# Patient Record
Sex: Male | Born: 1960 | Race: Black or African American | Hispanic: No | Marital: Single | Smoking: Never smoker
Health system: Southern US, Community
[De-identification: ages and names within clinical notes are randomized; demographics above are authoritative.]

## PROBLEM LIST (undated history)

## (undated) DIAGNOSIS — I10 Essential (primary) hypertension: Secondary | ICD-10-CM

## (undated) DIAGNOSIS — G4730 Sleep apnea, unspecified: Secondary | ICD-10-CM

## (undated) DIAGNOSIS — E785 Hyperlipidemia, unspecified: Secondary | ICD-10-CM

## (undated) HISTORY — DX: Essential (primary) hypertension: I10

## (undated) HISTORY — DX: Sleep apnea, unspecified: G47.30

## (undated) HISTORY — PX: DENTAL EXAMINATION UNDER ANESTHESIA: SHX1447

## (undated) HISTORY — DX: Hyperlipidemia, unspecified: E78.5

---

## 2012-06-21 ENCOUNTER — Ambulatory Visit (INDEPENDENT_AMBULATORY_CARE_PROVIDER_SITE_OTHER): Payer: Managed Care, Other (non HMO) | Admitting: Family Medicine

## 2012-06-21 VITALS — BP 154/88 | HR 61 | Temp 98.6°F | Resp 16 | Ht 70.5 in | Wt 223.0 lb

## 2012-06-21 DIAGNOSIS — I10 Essential (primary) hypertension: Secondary | ICD-10-CM

## 2012-06-21 DIAGNOSIS — E785 Hyperlipidemia, unspecified: Secondary | ICD-10-CM

## 2012-06-21 LAB — COMPREHENSIVE METABOLIC PANEL
ALT: 20 U/L (ref 0–53)
Albumin: 4.3 g/dL (ref 3.5–5.2)
CO2: 30 mEq/L (ref 19–32)
Calcium: 9.4 mg/dL (ref 8.4–10.5)
Chloride: 102 mEq/L (ref 96–112)
Potassium: 3.4 mEq/L — ABNORMAL LOW (ref 3.5–5.3)
Sodium: 141 mEq/L (ref 135–145)
Total Bilirubin: 0.7 mg/dL (ref 0.3–1.2)
Total Protein: 7.4 g/dL (ref 6.0–8.3)

## 2012-06-21 LAB — LIPID PANEL
Cholesterol: 264 mg/dL — ABNORMAL HIGH (ref 0–200)
VLDL: 66 mg/dL — ABNORMAL HIGH (ref 0–40)

## 2012-06-21 MED ORDER — NEBIVOLOL HCL 10 MG PO TABS: 10.0000 mg | ORAL_TABLET | Freq: Every day | ORAL | Status: DC

## 2012-06-21 MED ORDER — LISINOPRIL-HYDROCHLOROTHIAZIDE 20-12.5 MG PO TABS: 1.0000 | ORAL_TABLET | Freq: Two times a day (BID) | ORAL | Status: DC

## 2012-06-21 MED ORDER — AMLODIPINE BESYLATE 5 MG PO TABS: 5.0000 mg | ORAL_TABLET | Freq: Every day | ORAL | Status: DC

## 2012-06-21 MED ORDER — ROSUVASTATIN CALCIUM 20 MG PO TABS: 20.0000 mg | ORAL_TABLET | Freq: Every day | ORAL | Status: DC

## 2012-06-21 NOTE — Patient Instructions (Signed)
Continue same medication  Return in about 4 months or so for a recheck

## 2012-06-21 NOTE — Progress Notes (Signed)
Subjective: 51 year old man who has a history of hypertension and hyperlipidemia. He is out of his bysystolic blood pressure medication. He takes walks on a fairly regular basis, but it is been out of town a lot so that its broken out. Denies any chest pains headaches shortness of breath or other major issues. He still sees his cardiologist, Dr. Gery Carson.  Objective: Blood pressure is noted. On repeat I got 146/84. TMs normal. Throat clear. Neck supple without nodes. Chest clear to auscultation. Heart regular without murmur.   Assessment:  Hypertension Hyperlipidemia  Plan: Check labs. Advised to return in about for 4 months for followup check. He considers Dr. Chilton Carson to be his primary.

## 2013-01-05 ENCOUNTER — Ambulatory Visit (INDEPENDENT_AMBULATORY_CARE_PROVIDER_SITE_OTHER): Payer: Managed Care, Other (non HMO) | Admitting: Family Medicine

## 2013-01-05 ENCOUNTER — Ambulatory Visit: Payer: Managed Care, Other (non HMO)

## 2013-01-05 VITALS — BP 110/70 | HR 54 | Temp 98.3°F | Resp 12 | Ht 69.0 in | Wt 212.0 lb

## 2013-01-05 DIAGNOSIS — M25552 Pain in left hip: Secondary | ICD-10-CM

## 2013-01-05 DIAGNOSIS — M169 Osteoarthritis of hip, unspecified: Secondary | ICD-10-CM

## 2013-01-05 DIAGNOSIS — M25559 Pain in unspecified hip: Secondary | ICD-10-CM

## 2013-01-05 DIAGNOSIS — M1610 Unilateral primary osteoarthritis, unspecified hip: Secondary | ICD-10-CM

## 2013-01-05 MED ORDER — PREDNISONE 20 MG PO TABS: 40.0000 mg | ORAL_TABLET | Freq: Every day | ORAL | Status: DC

## 2013-01-05 NOTE — Progress Notes (Signed)
  Subjective:    Patient ID: Seth Carson, male    DOB: Jun 05, 1961, 52 y.o.   MRN: [id number]  HPI   Seth Carson is a very pleasant 52 yr old male here with concern for left hip pain.  Noted some pain last week.  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  Noted worsening hip pain yesterday and the day before.  Denies trauma to this hip.  Has never had problems with this hip in the past.  A little LBP as well and some knee discomfort which he attributes to his altered gait from the hip pain.  No groin pain.  No weakness, numbness, or tingling.  Right hip feels ok.     Review of Systems  Constitutional: Negative for fever and chills.  HENT: Negative.   Respiratory: Negative.   Cardiovascular: Negative.   Gastrointestinal: Negative.   Musculoskeletal: Positive for myalgias and arthralgias.  Skin: Negative.   Neurological: Negative.  Negative for weakness and numbness.       Objective:   Physical Exam  Vitals reviewed. Constitutional: He is oriented to person, place, and time. He appears well-developed and well-nourished. No distress.  HENT:  Head: Normocephalic and atraumatic.  Eyes: Conjunctivae are normal. No scleral icterus.  Musculoskeletal:       Right hip: Normal.       Left hip: He exhibits decreased range of motion and tenderness.       Left knee: Normal.       Legs: Generalized tenderness around left hip; TTP over the trochanteric bursa; increased pain with passive flexion and abduction; full passive IR, ER; antalgic gait; limited active flexion, extension, abduction  Neurological: He is alert and oriented to person, place, and time. He has normal reflexes.  Skin: Skin is warm and dry.  Psychiatric: He has a normal mood and affect. His behavior is normal.      [facility] reading (PRIMARY) by  Dr. Milus Glazier - small spur at the lip of the acetabulum      Assessment & Plan:   Hip pain, acute, left - Plan: DG Hip Complete Left, predniSONE (DELTASONE) 20 MG tablet  Osteoarthritis of hip - Plan: predniSONE (DELTASONE) 20 MG tablet   Seth Carson is a very pleasant 52 yr old male with left hip pain.  Pain began after increased physical activity at work.  X-ray shows a small spur at the left acetabulum, otherwise no acute findings.  Will try a short course of prednisone to calm down inflammation/pain.  May continue Tylenol as needed.  Note provided for work today.  Pt to RTC if worsening or not improving.

## 2013-01-05 NOTE — Patient Instructions (Addendum)
Begin taking the prednisone (40mg  daily for 5 days) - this will calm down the inflammation in the hip.  You may continue using Tylenol for pain relief.  Relative rest for the next day or two, but do not be completely sedentary.  Please let us know if you are worsening or not improving.

## 2013-06-10 ENCOUNTER — Ambulatory Visit (INDEPENDENT_AMBULATORY_CARE_PROVIDER_SITE_OTHER): Payer: Managed Care, Other (non HMO) | Admitting: Physician Assistant

## 2013-06-10 VITALS — BP 134/82 | HR 64 | Temp 98.6°F | Resp 18 | Ht 70.0 in | Wt 217.0 lb

## 2013-06-10 DIAGNOSIS — I10 Essential (primary) hypertension: Secondary | ICD-10-CM | POA: Insufficient documentation

## 2013-06-10 DIAGNOSIS — E785 Hyperlipidemia, unspecified: Secondary | ICD-10-CM | POA: Insufficient documentation

## 2013-06-10 DIAGNOSIS — R05 Cough: Secondary | ICD-10-CM

## 2013-06-10 MED ORDER — IPRATROPIUM BROMIDE 0.03 % NA SOLN: 2.0000 | Freq: Two times a day (BID) | NASAL | Status: DC

## 2013-06-10 MED ORDER — AMOXICILLIN 875 MG PO TABS: 875.0000 mg | ORAL_TABLET | Freq: Two times a day (BID) | ORAL | Status: DC

## 2013-06-10 MED ORDER — BENZONATATE 100 MG PO CAPS: 100.0000 mg | ORAL_CAPSULE | Freq: Three times a day (TID) | ORAL | Status: DC | PRN

## 2013-06-10 NOTE — Patient Instructions (Signed)
Get plenty of rest and drink at least 64 ounces of water daily. 

## 2013-06-10 NOTE — Progress Notes (Signed)
   Subjective:    Patient ID: Seth Carson, male    DOB: 1960-09-18, 52 y.o.   MRN: [id number]  PCP: [name], MD  Chief Complaint  Patient presents with  . Cough    comes and going for 2 weeks now    Medications, allergies, past medical history, surgical history, family history, social history and problem list reviewed and updated.  HPI  Cough x 2-3 weeks. "Thought I had a cold.  Took some stuff and it got better for a day or two." Symptoms recurred after spending a few hours outside, and then resolved a few days later.  Continues to experience this pattern of waxing/waning symptoms. Cough produces mucous.  Nasal congestion. No ST, HA, diarrhea, unexplained muscle or joint aches. Coughs to the point of gagging and feeling lightheaded on occasion.  Cough is worse when he exerts himself or gets hot. No LE edema.  Can sleep lying flat.  Review of Systems As above.    Objective:   Physical Exam Blood pressure 134/82, pulse 64, temperature 98.6 F (37 C), temperature source Oral, resp. rate 18, height 5\' 10"  (1.778 m), weight 217 lb (98.431 kg), SpO2 94.00%. Body mass index is 31.14 kg/(m^2). Well-developed, well nourished BM who is awake, alert and oriented, in NAD. HEENT: [place]/AT, PERRL, EOMI.  Sclera and conjunctiva are clear.  EAC are patent, TMs are normal in appearance. Nasal mucosa is moderately congested, pink and moist. OP is clear. Neck: supple, non-tender, no lymphadenopathy, thyromegaly. Heart: RRR, no murmur Lungs: normal effort, CTA Extremities: no cyanosis, clubbing or edema. Skin: warm and dry without rash. Psychologic: good mood and appropriate affect, normal speech and behavior.        Assessment & Plan:  1. Cough Likely subacute sinusitis and post-nasal draiange. - ipratropium (ATROVENT) 0.03 % nasal spray; Place 2 sprays into both nostrils 2 (two) times daily.  Dispense: 30 mL; Refill: 0 - benzonatate (TESSALON) 100 MG capsule; Take 1-2 capsules  (100-200 mg total) by mouth 3 (three) times daily as needed for cough.  Dispense: 40 capsule; Refill: 0 - amoxicillin (AMOXIL) 875 MG tablet; Take 1 tablet (875 mg total) by mouth 2 (two) times daily.  Dispense: 20 tablet; Refill: 0 -Supportive care.  Anticipatory guidance.  RTC if symptoms worsen/persist.    Fernande Bras, PA-C Physician Assistant-Certified [facility]

## 2013-07-08 ENCOUNTER — Ambulatory Visit (INDEPENDENT_AMBULATORY_CARE_PROVIDER_SITE_OTHER): Payer: Managed Care, Other (non HMO) | Admitting: Physician Assistant

## 2013-07-08 VITALS — BP 124/80 | HR 72 | Temp 98.4°F | Resp 16 | Ht 70.0 in | Wt 213.0 lb

## 2013-07-08 DIAGNOSIS — R001 Bradycardia, unspecified: Secondary | ICD-10-CM

## 2013-07-08 DIAGNOSIS — I10 Essential (primary) hypertension: Secondary | ICD-10-CM

## 2013-07-08 DIAGNOSIS — R0989 Other specified symptoms and signs involving the circulatory and respiratory systems: Secondary | ICD-10-CM

## 2013-07-08 DIAGNOSIS — Z1211 Encounter for screening for malignant neoplasm of colon: Secondary | ICD-10-CM

## 2013-07-08 DIAGNOSIS — Z23 Encounter for immunization: Secondary | ICD-10-CM

## 2013-07-08 DIAGNOSIS — Z125 Encounter for screening for malignant neoplasm of prostate: Secondary | ICD-10-CM

## 2013-07-08 DIAGNOSIS — E785 Hyperlipidemia, unspecified: Secondary | ICD-10-CM

## 2013-07-08 DIAGNOSIS — I498 Other specified cardiac arrhythmias: Secondary | ICD-10-CM

## 2013-07-08 DIAGNOSIS — R5381 Other malaise: Secondary | ICD-10-CM

## 2013-07-08 DIAGNOSIS — R5383 Other fatigue: Secondary | ICD-10-CM

## 2013-07-08 DIAGNOSIS — R0609 Other forms of dyspnea: Secondary | ICD-10-CM

## 2013-07-08 DIAGNOSIS — Z1159 Encounter for screening for other viral diseases: Secondary | ICD-10-CM

## 2013-07-08 DIAGNOSIS — R0683 Snoring: Secondary | ICD-10-CM

## 2013-07-08 LAB — COMPREHENSIVE METABOLIC PANEL
ALT: 20 U/L (ref 0–53)
AST: 19 U/L (ref 0–37)
Albumin: 4.2 g/dL (ref 3.5–5.2)
Alkaline Phosphatase: 47 U/L (ref 39–117)
BILIRUBIN TOTAL: 0.9 mg/dL (ref 0.3–1.2)
BUN: 13 mg/dL (ref 6–23)
CALCIUM: 9.7 mg/dL (ref 8.4–10.5)
CHLORIDE: 99 meq/L (ref 96–112)
CO2: 31 meq/L (ref 19–32)
CREATININE: 1.15 mg/dL (ref 0.50–1.35)
GLUCOSE: 91 mg/dL (ref 70–99)
Potassium: 3.6 mEq/L (ref 3.5–5.3)
SODIUM: 139 meq/L (ref 135–145)
TOTAL PROTEIN: 7.4 g/dL (ref 6.0–8.3)

## 2013-07-08 LAB — POCT CBC
Granulocyte percent: 42.4 %G (ref 37–80)
HEMATOCRIT: 47.3 % (ref 43.5–53.7)
HEMOGLOBIN: 15.1 g/dL (ref 14.1–18.1)
LYMPH, POC: 2.5 (ref 0.6–3.4)
MCH: 28.5 pg (ref 27–31.2)
MCHC: 31.9 g/dL (ref 31.8–35.4)
MCV: 89.3 fL (ref 80–97)
MID (cbc): 0.4 (ref 0–0.9)
MPV: 9.2 fL (ref 0–99.8)
POC Granulocyte: 2.1 (ref 2–6.9)
POC LYMPH PERCENT: 50.1 %L — AB (ref 10–50)
POC MID %: 7.5 %M (ref 0–12)
Platelet Count, POC: 215 10*3/uL (ref 142–424)
RBC: 5.3 M/uL (ref 4.69–6.13)
RDW, POC: 13.7 %
WBC: 5 10*3/uL (ref 4.6–10.2)

## 2013-07-08 LAB — PSA: PSA: 1.17 ng/mL (ref <=4.00)

## 2013-07-08 LAB — LIPID PANEL
CHOL/HDL RATIO: 3.6 ratio
CHOLESTEROL: 124 mg/dL (ref 0–200)
HDL: 34 mg/dL — ABNORMAL LOW (ref >39)
LDL Cholesterol: 59 mg/dL (ref 0–99)
Triglycerides: 153 mg/dL — ABNORMAL HIGH (ref <150)
VLDL: 31 mg/dL (ref 0–40)

## 2013-07-08 LAB — TSH: TSH: 1.272 u[IU]/mL (ref 0.350–4.500)

## 2013-07-08 LAB — HEPATITIS C ANTIBODY: HCV AB: NEGATIVE

## 2013-07-08 MED ORDER — NEBIVOLOL HCL 5 MG PO TABS: 5.0000 mg | ORAL_TABLET | Freq: Every day | ORAL | Status: DC

## 2013-07-08 MED ORDER — LISINOPRIL-HYDROCHLOROTHIAZIDE 20-12.5 MG PO TABS: 1.0000 | ORAL_TABLET | Freq: Two times a day (BID) | ORAL | Status: DC

## 2013-07-08 MED ORDER — AMLODIPINE BESYLATE 5 MG PO TABS: 5.0000 mg | ORAL_TABLET | Freq: Every day | ORAL | Status: DC

## 2013-07-08 MED ORDER — ROSUVASTATIN CALCIUM 20 MG PO TABS: 20.0000 mg | ORAL_TABLET | Freq: Every day | ORAL | Status: DC

## 2013-07-08 NOTE — Patient Instructions (Signed)
I will contact you with your lab results as soon as they are available.   If you have not heard from me in 2 weeks, please contact me.  The fastest way to get your results is to register for My Chart (see the instructions on the last page of this printout).  If you have not heard anything regarding the referrals (GI for colonoscopy and [facility] for Sleep Study) in 10 days, please contact our office.

## 2013-07-08 NOTE — Progress Notes (Signed)
Subjective:    Patient ID: Seth Carson, male    DOB: [birth year], 53 y.o.   MRN: [id number]  Reviewed medications, allergies, past medical history, family history and social history.  HPI  Not having any issues with blood pressure. Has stopped monitoring bp at home because it has consistently been within a normal range. Taking blood pressure medications as prescribed. Reports lightheadedness once or twice over the past 6 months. No dizziness, headaches or visual changes.   Still taking medication for hyperlipidemia but lipids have not been rechecked since 12/13. Taking medications as prescribed. Will miss a pill occasionally because take pill at night but it generally consistent.  Does not exercise. "Diet is not what it should be, only eat healthy things sometimes".   Was told by a friend that he stopped breathing multiple times while he was sleeping. She noticed episode three times.  Will have a pause when not breathing and then takes a huge gasp of air.  Some mornings, mouth is very dry when wakes up.  Back of mouth is swollen and occasionally sore.  Some days gets abnormally tired by the end of the day.  Has snored for as long as he can remember.  No history of allergies.    Review of Systems As above.     Objective:   Physical Exam  Vitals reviewed. Constitutional: He is oriented to person, place, and time. He appears well-developed and well-nourished.  HENT:  Right Ear: Tympanic membrane and external ear normal.  Left Ear: Tympanic membrane and external ear normal.  Nose: Nose normal.  Mouth/Throat: Uvula is midline, oropharynx is clear and moist and mucous membranes are normal. No posterior oropharyngeal edema.  No edema but the posterior pharynx appears to be diminished by the size of the surrounding structure.  Cardiovascular: Regular rhythm, normal heart sounds and normal pulses.   Occasional extrasystoles are present. Bradycardia present.   On auscultation, rate was  44 initially, with occasional ectopic beats.  Pulmonary/Chest: Effort normal and breath sounds normal.  Neurological: He is alert and oriented to person, place, and time.  Skin: Skin is warm and dry.  Psychiatric: He has a normal mood and affect. His behavior is normal. Judgment and thought content normal.   Results for orders placed in visit on 07/08/13  POCT CBC      Result Value Range   WBC 5.0  4.6 - 10.2 K/uL   Lymph, poc 2.5  0.6 - 3.4   POC LYMPH PERCENT 50.1 (*) 10 - 50 %L   MID (cbc) 0.4  0 - 0.9   POC MID % 7.5  0 - 12 %M   POC Granulocyte 2.1  2 - 6.9   Granulocyte percent 42.4  37 - 80 %G   RBC 5.30  4.69 - 6.13 M/uL   Hemoglobin 15.1  14.1 - 18.1 g/dL   HCT, POC 91.447.3  78.243.5 - 53.7 %   MCV 89.3  80 - 97 fL   MCH, POC 28.5  27 - 31.2 pg   MCHC 31.9  31.8 - 35.4 g/dL   RDW, POC 95.613.7     Platelet Count, POC 215  142 - 424 K/uL   MPV 9.2  0 - 99.8 fL   EKG: bradycardia with borderline first degree block as well as left ventricular hypertrophy. No ectopic beats or irregular rhythm. Reviewed with Dr. Cleta Albertsaub.  Marching in place raises pulse to 66.  The patient reports that during his stress test several years  ago, he had to walk for "about an hour" to get his heart rate up high enough.      Assessment & Plan:   1. HTN (hypertension) Well controlled with current medications. However, Bystolic seems to be causing bradycardia. Will reduce the dose of Bystolic from 10 mg to 5 mg and recheck in 1 week. If bp is still well controlled at that time, will continue Bystolic 5 mg. If bp uncontrolled, plan increase in amlodipine, which will contribute less to bradycardia.  - POCT CBC - Comprehensive metabolic panel - TSH - amLODipine (NORVASC) 5 MG tablet; Take 1 tablet (5 mg total) by mouth daily.  Dispense: 30 tablet; Refill: 12 - lisinopril-hydrochlorothiazide (PRINZIDE,ZESTORETIC) 20-12.5 MG per tablet; Take 1 tablet by mouth 2 (two) times daily.  Dispense: 60 tablet; Refill: 12 -  nebivolol (BYSTOLIC) 5 MG tablet; Take 1 tablet (5 mg total) by mouth daily.  Dispense: 30 tablet; Refill: 12  2. Hyperlipidemia - Lipid panel - rosuvastatin (CRESTOR) 20 MG tablet; Take 1 tablet (20 mg total) by mouth daily.  Dispense: 30 tablet; Refill: 12  3. Need for hepatitis C screening test - Hepatitis C antibody  4. Need for Tdap vaccination - Tdap vaccine greater than or equal to 7yo IM  5. Need for influenza vaccination - Flu Vaccine QUAD 36+ mos IM  6. Bradycardia Most likely due to Bystolic. Decrease Bystolic from 10 mg to 5 mg. Re-check in 1 week.  - TSH - EKG 12-Lead  7. Screening for prostate cancer - PSA  8. Snoring - Ambulatory referral to Sleep Studies  9. Fatigue TSH WNL  - TSH - Ambulatory referral to Sleep Studies - EKG 12-Lead  10. Screening for colon cancer - Ambulatory referral to Gastroenterology

## 2013-07-09 ENCOUNTER — Ambulatory Visit (INDEPENDENT_AMBULATORY_CARE_PROVIDER_SITE_OTHER): Payer: Managed Care, Other (non HMO) | Admitting: Neurology

## 2013-07-09 ENCOUNTER — Encounter: Payer: Self-pay | Admitting: Physician Assistant

## 2013-07-09 ENCOUNTER — Encounter: Payer: Self-pay | Admitting: Neurology

## 2013-07-09 VITALS — BP 124/73 | HR 48 | Temp 98.8°F | Ht 70.0 in | Wt 217.0 lb

## 2013-07-09 DIAGNOSIS — R001 Bradycardia, unspecified: Secondary | ICD-10-CM | POA: Insufficient documentation

## 2013-07-09 DIAGNOSIS — G4733 Obstructive sleep apnea (adult) (pediatric): Secondary | ICD-10-CM

## 2013-07-09 DIAGNOSIS — I10 Essential (primary) hypertension: Secondary | ICD-10-CM

## 2013-07-09 NOTE — Progress Notes (Signed)
Subjective:    Patient ID: Seth Carson is a 53 y.o. male.  HPI  Huston Foley, MD, PhD [facility] [address] [address] [place], [place] [address]  Dear Seth Carson,   I saw your patient, Seth Carson, upon your kind request in my neurologic clinic today for initial consultation of his sleep disorder, in particular, concern for obstructive sleep apnea. The patient is unaccompanied today. As you know, Seth Carson is a very pleasant 53 year old right-handed gentleman with an underlying medical history of hyperlipidemia, bradycardia, and hypertension as well as obesity, who reports a long-standing history of snoring and recent report of breathing pauses while asleep. He endorses mouth dryness and sore mouth in the mornings and tiredness during the day.  His typical bedtime is reported to be around 10 PM and usual wake time is around 6 to 6:30 AM. Sleep onset typically occurs within 30 minutes. He reports feeling fairly well rested upon awakening. He wakes up on an average 2 times in the middle of the night and has to go to the bathroom 0 to 1 time on a typical night. He denies morning headaches.  He reports excessive daytime somnolence (EDS) sometimes and His Epworth Sleepiness Score (ESS) is 7/24 today. He has not fallen asleep while driving. The patient has not been taking a planned nap.   He has been known to snore for the past few years. Snoring is reportedly mild to moderate, and associated with choking sounds and witnessed apneas. The patient admits to an occasional sense of choking or strangling feeling and has woken himself up from his own snoring. There is no report of nighttime reflux, with no nighttime cough experienced. The patient has not noted any RLS symptoms and is not known to kick while asleep or before falling asleep. There is family history of OSA in his brother who has been on CPAP.  He is not a very restless sleeper.   He denies cataplexy,  sleep paralysis, hypnagogic or hypnopompic hallucinations, or sleep attacks. He does not report any vivid dreams, nightmares, dream enactments, or parasomnias, such as sleep talking or sleep walking. The patient has not had a sleep study or a home sleep test.  He consumes 2 to 3 caffeinated beverages per day, usually in the form of sodas, as late as 8 PM.   His bedroom is usually dark and cool. There is a TV in the bedroom and usually it is not on at night. He does not smoke and does not drink any alcohol except for maybe once a year.  His Past Medical History Is Significant For: Past Medical History  Diagnosis Date  . Hypertension   . Hyperlipidemia     His Past Surgical History Is Significant For: History reviewed. No pertinent past surgical history.  His Family History Is Significant For: Family History  Problem Relation Age of Onset  . Emphysema Father   . Sleep apnea Brother   . Hypertension Mother     His Social History Is Significant For: History   Social History  . Marital Status: Single    Spouse Name: n/a    Number of Children: 0  . Years of Education: 12   Occupational History  . Systems developer    Social History Main Topics  . Smoking status: Never Smoker   . Smokeless tobacco: None  . Alcohol Use: No  . Drug Use: No  . Sexual Activity: None   Other Topics Concern  . None  Social History Narrative   Lives with his mother "she won't let me leave."    His Allergies Are:  No Known Allergies:   His Current Medications Are:  Outpatient Encounter Prescriptions as of 07/09/2013  Medication Sig  . amLODipine (NORVASC) 5 MG tablet Take 1 tablet (5 mg total) by mouth daily.  Marland Kitchen. lisinopril-hydrochlorothiazide (PRINZIDE,ZESTORETIC) 20-12.5 MG per tablet Take 1 tablet by mouth 2 (two) times daily.  . nebivolol (BYSTOLIC) 5 MG tablet Take 1 tablet (5 mg total) by mouth daily.  . rosuvastatin (CRESTOR) 20 MG tablet Take 1 tablet (20 mg total) by mouth daily.  .  [DISCONTINUED] ipratropium (ATROVENT) 0.03 % nasal spray Place 2 sprays into both nostrils 2 (two) times daily.   Review of Systems:  Out of a complete 14 point review of systems, all are reviewed and negative with the exception of these symptoms as listed below:   Review of Systems  Constitutional: Negative.   HENT: Negative.   Eyes: Negative.   Respiratory: Negative.   Cardiovascular: Negative.   Gastrointestinal: Negative.   Endocrine: Negative.   Genitourinary: Negative.   Musculoskeletal: Negative.   Skin: Negative.   Allergic/Immunologic: Negative.   Neurological: Negative.   Hematological: Negative.   Psychiatric/Behavioral: Negative.   All other systems reviewed and are negative.   Objective:  Neurologic Exam  Physical Exam Physical Examination:   Filed Vitals:   07/09/13 1048  BP: 124/73  Pulse: 48  Temp: 98.8 F (37.1 C)    General Examination: The patient is a very pleasant 53 y.o. male in no acute distress. He appears well-developed and well-nourished and well groomed.   HEENT: Normocephalic, atraumatic, pupils are equal, round and reactive to light and accommodation. Funduscopic exam is normal with sharp disc margins noted. Extraocular tracking is good without limitation to gaze excursion or nystagmus noted. Normal smooth pursuit is noted. Hearing is grossly intact. Tympanic membranes are clear bilaterally. Face is symmetric with normal facial animation and normal facial sensation. Speech is clear with no dysarthria noted. There is no hypophonia. There is no lip, neck/head, jaw or voice tremor. Neck is supple with full range of passive and active motion. There are no carotid bruits on auscultation. Oropharynx exam reveals: mild mouth dryness, adequate dental hygiene and marked airway crowding, due to large tongue, and large uvula, larger tonsils. Mallampati is class II. Tongue protrudes centrally and palate elevates symmetrically. Tonsils are 3+ in size. Neck size  is 17.5 inches.   Chest: Clear to auscultation without wheezing, rhonchi or crackles noted.  Heart: S1+S2+0, regular and normal without murmurs, rubs or gallops noted.   Abdomen: Soft, non-tender and non-distended with normal bowel sounds appreciated on auscultation.  Extremities: There is no pitting edema in the distal lower extremities bilaterally. Pedal pulses are intact.  Skin: Warm and dry without trophic changes noted. There are no varicose veins.  Musculoskeletal: exam reveals no obvious joint deformities, tenderness or joint swelling or erythema.   Neurologically:  Mental status: The patient is awake, alert and oriented in all 4 spheres. His memory, attention, language and knowledge are appropriate. There is no aphasia, agnosia, apraxia or anomia. Speech is clear with normal prosody and enunciation. Thought process is linear. Mood is congruent and affect is normal.  Cranial nerves are as described above under HEENT exam. In addition, shoulder shrug is normal with equal shoulder height noted. Motor exam: Normal bulk, strength and tone is noted. There is no drift, tremor or rebound. Romberg is negative. Reflexes are  2+ throughout. Toes are downgoing bilaterally. Fine motor skills are intact with normal finger taps, normal hand movements, normal rapid alternating patting, normal foot taps and normal foot agility.  Cerebellar testing shows no dysmetria or intention tremor on finger to nose testing. Heel to shin is unremarkable bilaterally. There is no truncal or gait ataxia.  Sensory exam is intact to light touch, pinprick, vibration, temperature sense in the upper and lower extremities.  Gait, station and balance are unremarkable. No veering to one side is noted. No leaning to one side is noted. Posture is age-appropriate and stance is narrow based. No problems turning are noted. He turns en bloc. Tandem walk is unremarkable. Intact toe and heel stance is noted.               Assessment  and Plan:   In summary, Seth Carson is a very pleasant 53 y.o.-year old male with a history, including FHx, and physical exam concerning for obstructive sleep apnea (OSA). I had a long chat with the patient about my findings and the diagnosis, its prognosis and treatment options. We talked about medical treatments and non-pharmacological approaches. I explained in particular the risks and ramifications of untreated moderate to severe OSA, especially with respect to developing cardiovascular disease down the Road, including congestive heart failure, difficult to treat hypertension, cardiac arrhythmias, or stroke. Even type 2 diabetes has in part been linked to untreated OSA. We talked about trying to maintain a healthy lifestyle in general, as well as the importance of weight control. I encouraged the patient to eat healthy, exercise daily and keep well hydrated, to keep a scheduled bedtime and wake time routine, to not skip any meals and eat healthy snacks in between meals.  I recommended the following at this time: sleep study with potential positive airway pressure titration.  I explained the sleep test procedure to the patient and also outlined possible surgical and non-surgical treatment options of OSA, including the use of a custom-made dental device, upper airway surgical options, such as pillar implants, radiofrequency surgery, tongue base surgery, and UPPP. I also explained the CPAP treatment option to the patient, who indicated that he would be willing to try CPAP if the need arises. I explained the importance of being compliant with PAP treatment, not only for insurance purposes but primarily to improve His symptoms, and for the patient's long term health benefit, including to reduce His cardiovascular risks. I answered all his questions today and the patient was in agreement. I would like to see him back after the sleep study is completed and encouraged him to call with any interim questions,  concerns, problems or updates.   Thank you very much for allowing me to participate in the care of this nice patient. If I can be of any further assistance to you please do not hesitate to call me at [phone].  Sincerely,   Huston Foley, MD, PhD

## 2013-07-09 NOTE — Progress Notes (Signed)
I have examined this patient along with the student and agree. Case discussed with Dr. Cleta Albertsaub.

## 2013-07-09 NOTE — Patient Instructions (Signed)

## 2013-07-29 ENCOUNTER — Encounter: Payer: Self-pay | Admitting: Internal Medicine

## 2013-08-04 ENCOUNTER — Telehealth: Payer: Self-pay | Admitting: *Deleted

## 2013-08-04 ENCOUNTER — Ambulatory Visit (INDEPENDENT_AMBULATORY_CARE_PROVIDER_SITE_OTHER): Payer: Managed Care, Other (non HMO) | Admitting: *Deleted

## 2013-08-04 ENCOUNTER — Encounter: Payer: Self-pay | Admitting: *Deleted

## 2013-08-04 VITALS — HR 50

## 2013-08-04 DIAGNOSIS — G4731 Primary central sleep apnea: Secondary | ICD-10-CM

## 2013-08-04 DIAGNOSIS — R0683 Snoring: Secondary | ICD-10-CM

## 2013-08-04 DIAGNOSIS — G4733 Obstructive sleep apnea (adult) (pediatric): Secondary | ICD-10-CM

## 2013-08-04 NOTE — Telephone Encounter (Signed)
Message copied by Daryll DrownHEUGLY, Tiffanni Scarfo B on Wed Aug 04, 2013 11:58 AM ------      Message from: Huston FoleyATHAR, SAIMA      Created: Tue Jul 27, 2013  4:25 PM      Regarding: RE: Sleep Study       Okay to proceed with home sleep testing, please put an order and cancel and attended PSG order, thanks      sa            ----- Message -----         From: Janeece AgeeSharon Gannaway         Sent: 07/27/2013   1:03 PM           To: Huston FoleySaima Athar, MD      Subject: Sleep Study                                              Dr. Frances FurbishAthar,            Rosann Auerbachigna would only approve a HST for this patient, so I want to make sure this is okay with you. If it's okay, I can have Katrina Daddona to put the order in Epic.                  Jasmine DecemberSharon       ------

## 2013-08-04 NOTE — Sleep Study (Signed)
Patient arrives for HST instruction.  Patient is given written instructions, picture instructions, and a demonstration on how to use HST unit.  All questions / concerns were addressed by technologist.  Financial responsibility was explained.  Follow up information was given to patient regarding how results would be received.  Expected return date is 08/06/13.

## 2013-08-04 NOTE — Telephone Encounter (Signed)
Contacted patient for HST appointment.  He will come in today at 4:30 PM for instruction and setup.  Orders entered with this encounter and submitted to Dr. Frances FurbishAthar for cosign, in lab study orders were discontinued for insurance reasons. -sh

## 2013-08-06 ENCOUNTER — Other Ambulatory Visit: Payer: Self-pay | Admitting: Neurology

## 2013-08-06 DIAGNOSIS — G4733 Obstructive sleep apnea (adult) (pediatric): Secondary | ICD-10-CM

## 2013-08-06 DIAGNOSIS — G4731 Primary central sleep apnea: Secondary | ICD-10-CM

## 2013-08-06 NOTE — Progress Notes (Signed)
Recent HST shows severe sleep disordered breathing with AHI around 48/h, but evidence of prim. CSA. Will order in house sleep study for titration with PAP.

## 2013-08-06 NOTE — Sleep Study (Signed)
See media tab for full report  Out of Center Sleep Test [facility](OCST) Report Patient: Seth Carson Procedure Number: [id number]  DOB:  30-Jan-1961 Date:  08/04/13  MR# [id number] Referring MD:  Porfirio Oarhelle Jeffery, PA-C  INDICATIONS FOR TESTING: In lab split night study was ordered, however due to [employer]'s requirements, home sleep testing was performed to evaluate patient with high pre-test probability for sleep apnea. PAST MEDICAL HISTORY:  Hypertension, Hyperlipidemia SLEEP SYMPTOMS:  excessive daytime sleepiness (Epworth score 7/24), mild to moderate snoring associated with choking sounds and witnessed apneas, awakening with a choking sensation PHYSICAL EXAMINATION:  BMI 31, neck size 17.5", Mallampati class II, tonsils 3+ size, marked airway crowding MEDICATIONS: Amlodipine, Lisinopril-Hydrochlorothiazide, Bystolic, Crestor TECHNICAL SPECIFICATIONS AND SCORING CRITERIA:  This is a 5 channel digital Apnea Link-Plus OCST performed in the patient's home.  Data acquisition and analysis were performed using the ResMed Apnea Link software system.  Snore and respiratory flow (nasal pressure) signals were sampled at 100 Hz.  Respiratory effort 10 Hz, and Nonin TM pulse & Oximetry at 1 Hz.  SpO2 averaging was less than or equal to 3 seconds @ 80 beats per minute.  This is a standard 5 channel montage with one channel for nasal pressure flow transducer, one for thoracic respiratory effort, one for snore, one for pulse and one for SpO2.In accordance with AASM standards, apneas were scored during any drops in nasal pressure transducer excursion by greater than or equal to 90% of baseline lasting 10 seconds or longer.  Hypopneas were scored during any drops in nasal pressure transducer excursion by greater than or equal to 30% of baseline lasting 10 seconds or longer in association with a minimum SpO2 desaturation of 4% from pre-event baseline. RECORDING OUTCOMES:  See attached Report   TECHNICAL ACCURACY OF TEST:  adequate for  interpretation -Data manually scored by [facility] Ralene Gasparyan, RPSGT.   IMPRESSION:  This home sleep test demonstrates severe sleep disordered breathing with a primary central component and a lesser obstructive component. 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      I certify that I have reviewed the entire raw data recording prior to the issuance of this report in accordance with the Standards of Accreditation of the American Academy of Sleep Medicine (AASM)   _______________________________________________________                           Date signed: 08/06/13 Huston FoleySaima Athar, PhD, MD Diplomat, American Board of Neurology  Diplomat, American Board of Sleep Medicine Member, American Academy of Sleep Medicine

## 2013-08-11 ENCOUNTER — Telehealth: Payer: Self-pay | Admitting: Neurology

## 2013-08-11 ENCOUNTER — Encounter: Payer: Self-pay | Admitting: *Deleted

## 2013-08-11 NOTE — Telephone Encounter (Signed)
I called and left a message for the patient about his recent home sleep test results. I informed the patient that the study revealed severe sleep disordered breathing with evidence of central sleep apnea. Dr. Frances FurbishAthar recommends CPAP therapy which means coming into the lab for a titration and mask fitting (overnight study). I will fax a copy of the report to Dr. Janan RidgeJeffrey Greene's office and mail a copy to the patient.

## 2013-08-17 ENCOUNTER — Encounter: Payer: Self-pay | Admitting: Family Medicine

## 2013-08-17 DIAGNOSIS — G4730 Sleep apnea, unspecified: Secondary | ICD-10-CM | POA: Insufficient documentation

## 2013-09-14 ENCOUNTER — Ambulatory Visit (INDEPENDENT_AMBULATORY_CARE_PROVIDER_SITE_OTHER): Payer: Managed Care, Other (non HMO)

## 2013-09-14 DIAGNOSIS — G4733 Obstructive sleep apnea (adult) (pediatric): Secondary | ICD-10-CM

## 2013-09-14 DIAGNOSIS — G479 Sleep disorder, unspecified: Secondary | ICD-10-CM

## 2013-09-14 DIAGNOSIS — G4731 Primary central sleep apnea: Secondary | ICD-10-CM

## 2013-09-22 ENCOUNTER — Telehealth: Payer: Self-pay | Admitting: Neurology

## 2013-09-22 DIAGNOSIS — G4731 Primary central sleep apnea: Secondary | ICD-10-CM

## 2013-09-22 DIAGNOSIS — G4733 Obstructive sleep apnea (adult) (pediatric): Secondary | ICD-10-CM

## 2013-09-22 DIAGNOSIS — R9431 Abnormal electrocardiogram [ECG] [EKG]: Secondary | ICD-10-CM

## 2013-09-24 NOTE — Telephone Encounter (Signed)
Please call and inform patient that I have entered an order for treatment with PAP. He did well during the latest sleep study with BiPAP. We will, therefore, arrange for a machine for home use through a DME (durable medical equipment) company of His choice; and I will see the patient back in follow-up in about 6 weeks. Please also explain to the patient that I will be looking out for compliance data downloaded from the machine, which can be done remotely through a modem at times or stored on an SD card in the back of the machine. At the time of the followup appointment we will discuss sleep study results and how it is going with PAP treatment at home. Please advise patient to bring His machine at the time of the visit; at least for the first visit, even though this is cumbersome. Bringing the machine for every visit after that may not be needed, but often helps for the first visit. Please also make sure, the patient has a follow-up appointment with me in about 6 weeks from the setup date, thanks.   Reva Pinkley, MD, PhD [facility] ([facility]  

## 2013-09-27 NOTE — Telephone Encounter (Signed)
Called patient to discuss sleep study results.  Discussed findings, recommendations and follow up care.  Patient understood well and all questions were answered.   Discussed Bipap ST. A copy of the sleep study interpretive report as well as a letter with info regarding contact info for the DME company, the importance of CPAP compliance, and the date of the follow up appointment info will be mailed to the patient's home.  Pt scheduled follow up with Dr. Frances FurbishAthar on 12/06/13 at 9:00 AM.  Orders will be forwarded to [employer] [place].

## 2013-09-28 ENCOUNTER — Encounter: Payer: Self-pay | Admitting: *Deleted

## 2013-09-30 ENCOUNTER — Encounter: Payer: Self-pay | Admitting: Physician Assistant

## 2013-10-11 ENCOUNTER — Other Ambulatory Visit: Payer: Self-pay

## 2013-10-11 ENCOUNTER — Encounter: Payer: Self-pay | Admitting: Internal Medicine

## 2013-10-11 DIAGNOSIS — I10 Essential (primary) hypertension: Secondary | ICD-10-CM

## 2013-10-12 ENCOUNTER — Telehealth: Payer: Self-pay

## 2013-10-12 NOTE — Telephone Encounter (Signed)
PA needed for Bystolic. Completed form on covermymeds.

## 2013-11-01 ENCOUNTER — Encounter: Payer: Self-pay | Admitting: Neurology

## 2013-11-03 NOTE — Progress Notes (Signed)
Quick Note:  I reviewed the patient's BiPAP compliance data from 10/08/2013 to 10/30/2013, which is a total of 23 days, during which time the patient used PAP every day. The average usage for all days was 6 hours and 56 minutes. The percent used days greater than 4 hours was 100 %, indicating superb compliance. The residual AHI was 6.3 per hour, indicating a slightly suboptimal treatment pressure of 15/10 cwp with a backup rate of 12. Air leak from the mask was low. I will review this data with the patient at the next office visit, which is currently routinely scheduled for 12/06/2013 at 9 AM, provide feedback and additional troubleshooting if need be.  Huston FoleySaima Terease Marcotte, MD, PhD [facility] ([facility]   ______

## 2013-11-05 ENCOUNTER — Encounter: Payer: Self-pay | Admitting: Neurology

## 2013-11-10 ENCOUNTER — Encounter: Payer: Self-pay | Admitting: Neurology

## 2013-11-19 ENCOUNTER — Encounter: Payer: Self-pay | Admitting: Neurology

## 2013-11-19 NOTE — Progress Notes (Signed)
Quick Note:  I reviewed the patient's BiPAP compliance data from 10/11/2013 to 11/09/2013, which is a total of 30 days, during which time the patient used PAP every day. The average usage for all days was 6 hours and 46 minutes. The percent used days greater than 4 hours was 100 %, indicating superb compliance. The residual AHI was mildly suboptimal at 6.4 per hour, indicating a mildly suboptimal treatment pressure of 15/10 cwp with a backup rate of 12 breaths per minute. Air leak from the mask was low at 11.3 L per minute at the 95th percentile. I will review this data with the patient at the next office visit, which is currently routinely scheduled for 12/06/2013 at 9 AM, provide feedback and additional troubleshooting if need be.  Huston Foley, MD, PhD [facility] ([facility]   ______

## 2013-12-06 ENCOUNTER — Encounter: Payer: Self-pay | Admitting: Neurology

## 2013-12-06 ENCOUNTER — Ambulatory Visit (INDEPENDENT_AMBULATORY_CARE_PROVIDER_SITE_OTHER): Payer: Managed Care, Other (non HMO) | Admitting: Neurology

## 2013-12-06 VITALS — BP 127/80 | HR 47 | Temp 97.5°F | Ht 70.0 in | Wt 224.0 lb

## 2013-12-06 DIAGNOSIS — G4733 Obstructive sleep apnea (adult) (pediatric): Secondary | ICD-10-CM

## 2013-12-06 DIAGNOSIS — R9431 Abnormal electrocardiogram [ECG] [EKG]: Secondary | ICD-10-CM

## 2013-12-06 DIAGNOSIS — G4731 Primary central sleep apnea: Secondary | ICD-10-CM

## 2013-12-06 DIAGNOSIS — I10 Essential (primary) hypertension: Secondary | ICD-10-CM

## 2013-12-06 NOTE — Progress Notes (Signed)
Subjective:    Patient ID: Seth Carson is a 53 y.o. male.  HPI    Interim history:   Seth Carson is a very pleasant 53 year old right-handed gentleman with an underlying medical history of hyperlipidemia, bradycardia, obesity and hypertension, who presents for followup consultation of his complex sleep apnea. He is unaccompanied today. I first met him on 07/09/2013, at which time he reported snoring, mouth dryness, daytime somnolence and witnessed apneas. I invited him back for sleep study but his insurance mandated that he have a home sleep test. He had a home sleep test which showed primary central sleep apnea syndrome with 46% of the events being central in nature and 35% of the events were obstructive in nature for total AHI estimated at 49 per hour. Desaturation nadir was 65% during that home sleep study. He was invited back for a full night titration study with positive airway pressure. He had a Pap titration study on 09/14/2013 and I went over his home sleep test results as well as titration test results in detail today. His sleep efficiency during the study was 90.6% with a latency to sleep of 19 minutes and wake after sleep onset of 21 minutes with mild to moderate sleep fragmentation noted. His arousal index was mildly elevated. He had a normal percentage of stage I sleep, an increased percentage of stage II sleep, 3.5% of slow-wave sleep and an increased percentage of REM sleep at 28.1% with a reduced REM latency. He had no significant periodic leg movements and rare PVCs were noted. He was initiated on CPAP first utilizing a large nasal mask which he chose over the nasal pillows mask. Almost immediately after CPAP was started he exhibited central apneas. CPAP was gradually titrated from 5-8 cm with ongoing central respiratory events. He was therefore switched to BiPAP at a pressure of 10/6 cm and BiPAP was titrated to 11/7 cm with persistent central events and the patient was therefore  switched to BiPAP ST and titrated to a final pressure of 16/12 cm with a rate of 12. Supine REM sleep was achieved. Residual AHI was 2.9 per hour. Oxygen saturation was 93% on average with a nadir of 80%. I prescribed BiPAP ST at a pressure of 15/10 with a rate of 12 for him. I reviewed the patient's BiPAP compliance data from 10/11/2013 to 11/09/2013, which is a total of 30 days, during which time the patient used PAP every day. The average usage for all days was 6 hours and 46 minutes. The percent used days greater than 4 hours was 100 %, indicating superb compliance. The residual AHI was mildly suboptimal at 6.4 per hour, indicating a mildly suboptimal treatment pressure of 15/10 cwp with a backup rate of 12 breaths per minute. Air leak from the mask was low at 11.3 L per minute at the 95th percentile.  Today, I reviewed his compliance data from 11/05/2013 through 12/04/2013 which is a total of 30 days during which time he used his BiPAP every night with 100% compliance, average usage of 6 hours and 36 minutes, residual AHI slightly suboptimal at 6.5 and leak acceptable at 20.  Today, he reports feeling much better, much less tired during the day and he has adjusted to the treatment well. He is feeling well. He does note that the mass seems to be leaking more than in the beginning. In fact, when I looked at his leak graft, it does appear to be worse in the last couple weeks. In fact when we  first got his 30 day download from the first month of treatment his 95th percentile of leak was only 11.3 lpm. He has no new complaints. He had no changes in his medical history or medications.  His Past Medical History Is Significant For: Past Medical History  Diagnosis Date  . Hypertension   . Hyperlipidemia     His Past Surgical History Is Significant For: No past surgical history on file.  His Family History Is Significant For: Family History  Problem Relation Age of Onset  . Emphysema Father   . Sleep  apnea Brother   . Hypertension Mother     His Social History Is Significant For: History   Social History  . Marital Status: Single    Spouse Name: n/a    Number of Children: 0  . Years of Education: 12   Occupational History  . Scientist, research (physical sciences)    Social History Main Topics  . Smoking status: Never Smoker   . Smokeless tobacco: None  . Alcohol Use: No  . Drug Use: No  . Sexual Activity: None   Other Topics Concern  . None   Social History Narrative   Lives with his mother "she won't let me leave."    His Allergies Are:  No Known Allergies:   His Current Medications Are:  Outpatient Encounter Prescriptions as of 12/06/2013  Medication Sig  . amLODipine (NORVASC) 5 MG tablet Take 1 tablet (5 mg total) by mouth daily.  Marland Kitchen lisinopril-hydrochlorothiazide (PRINZIDE,ZESTORETIC) 20-12.5 MG per tablet Take 1 tablet by mouth 2 (two) times daily.  . nebivolol (BYSTOLIC) 5 MG tablet Take 1 tablet (5 mg total) by mouth daily.  . rosuvastatin (CRESTOR) 20 MG tablet Take 1 tablet (20 mg total) by mouth daily.  :  Review of Systems:  Out of a complete 14 point review of systems, all are reviewed and negative with the exception of these symptoms as listed below:  Review of Systems  Constitutional: Negative.   HENT: Negative.   Eyes: Negative.   Respiratory: Positive for apnea.   Cardiovascular: Negative.   Gastrointestinal: Negative.   Endocrine: Negative.   Genitourinary: Negative.   Musculoskeletal: Negative.   Skin: Negative.   Allergic/Immunologic: Negative.   Neurological: Negative.   Hematological: Negative.   Psychiatric/Behavioral: Positive for sleep disturbance.    Objective:  Neurologic Exam  Physical Exam Physical Examination:   Filed Vitals:   12/06/13 0859  BP: 127/80  Pulse: 47  Temp: 97.5 F (36.4 C)     General Examination: The patient is a very pleasant 53 y.o. male in no acute distress. He appears well-developed and well-nourished and well  groomed.   HEENT: Normocephalic, atraumatic, pupils are equal, round and reactive to light and accommodation. Funduscopic exam is normal with sharp disc margins noted. Extraocular tracking is good without limitation to gaze excursion or nystagmus noted. Normal smooth pursuit is noted. Hearing is grossly intact. Face is symmetric with normal facial animation and normal facial sensation. Speech is clear with no dysarthria noted. There is no hypophonia. There is no lip, neck/head, jaw or voice tremor. Neck is supple with full range of passive and active motion. There are no carotid bruits on auscultation. Oropharynx exam reveals: mild mouth dryness, adequate dental hygiene and marked airway crowding, due to large tongue, and large uvula, larger tonsils. Mallampati is class II. Tongue protrudes centrally and palate elevates symmetrically.   Chest: Clear to auscultation without wheezing, rhonchi or crackles noted.  Heart: S1+S2+0, regular and normal  without murmurs, rubs or gallops noted.   Abdomen: Soft, non-tender and non-distended with normal bowel sounds appreciated on auscultation.  Extremities: There is no pitting edema in the distal lower extremities bilaterally. Pedal pulses are intact.  Skin: Warm and dry without trophic changes noted. There are no varicose veins.  Musculoskeletal: exam reveals no obvious joint deformities, tenderness or joint swelling or erythema.   Neurologically:  Mental status: The patient is awake, alert and oriented in all 4 spheres. His memory, attention, language and knowledge are appropriate. There is no aphasia, agnosia, apraxia or anomia. Speech is clear with normal prosody and enunciation. Thought process is linear. Mood is congruent and affect is normal.  Cranial nerves are as described above under HEENT exam. In addition, shoulder shrug is normal with equal shoulder height noted. Motor exam: Normal bulk, strength and tone is noted. There is no drift, tremor or  rebound. Romberg is negative. Reflexes are 2+ throughout. Toes are downgoing bilaterally. Fine motor skills are intact with normal finger taps, normal hand movements, normal rapid alternating patting, normal foot taps and normal foot agility.  Cerebellar testing shows no dysmetria or intention tremor on finger to nose testing. Heel to shin is unremarkable bilaterally. There is no truncal or gait ataxia.  Sensory exam is intact to light touch, pinprick, vibration, temperature sense in the upper and lower extremities.  Gait, station and balance are unremarkable. No veering to one side is noted. No leaning to one side is noted. Posture is age-appropriate and stance is narrow based. No problems turning are noted. He turns en bloc. Tandem walk is unremarkable. Intact toe and heel stance is noted.         Assessment and Plan:   In summary, Giancarlo Askren is a very pleasant 53 y.o.-year old male with an underlying medical history of hyperlipidemia, bradycardia, obesity and hypertension, who presents for followup consultation of his complex sleep apnea with a primary central apnea component. He has been established on BiPAP treatment at a pressure of 15/10 with a rate of 12/min. His physical exam is stable and He indicates good results with the use of BiPAP, and good tolerance of the pressure and mask. I reviewed his home sleep study results as well as his titration sleep study results in detail today. I reviewed the compliance data with the patient and congratulated him on his superb compliance. He does have a slightly suboptimal AHI at 6.5 with our goal being less than 5 per hour but since he is on a relatively high pressure and is doing well I would like to watch him a little while longer on this setting. I have encouraged him to continue to use PAP regularly to help reduce cardiovascular risk.  His leak does appear to be higher in the last couple weeks. I do not believe he is up for a mask renewal but I have  encouraged him to get in touch with his DME company to see if there is anything they can do in the interim to improve the leak and brigde the time until he is up for a new mask. We also talked about trying to maintaining a healthy lifestyle in general. I encouraged the patient to eat healthy, exercise daily and keep well hydrated, to keep a scheduled bedtime and wake time routine, to not skip any meals and eat healthy snacks in between meals and to have protein with every meal. I stressed the importance of regular exercise.   I answered all his questions  today and the patient was in agreement with the above outlined plan. I would like to see the patient back in 6 months, sooner if the need arises and encouraged him to call with any interim questions, concerns, problems or updates.

## 2013-12-06 NOTE — Patient Instructions (Addendum)
Please continue using your biPAP regularly. While your insurance requires that you use CPAP at least 4 hours each night on 70% of the nights, I recommend, that you not skip any nights and use it throughout the night if you can. Getting used to PAP and staying with the treatment long term does take time and patience and discipline. Untreated obstructive sleep apnea when it is moderate to severe can have an adverse impact on cardiovascular health and raise her risk for heart disease, arrhythmias, hypertension, congestive heart failure, stroke and diabetes. Untreated obstructive sleep apnea causes sleep disruption, nonrestorative sleep, and sleep deprivation. This can have an impact on your day to day functioning and cause daytime sleepiness and impairment of cognitive function, memory loss, mood disturbance, and problems focussing. Using PAP regularly can improve these symptoms. Keep up the good work! I will see you back in 6 months for sleep apnea check up, and if you continue to do well on biPAP I will see you once a year thereafter.

## 2014-03-24 NOTE — Telephone Encounter (Signed)
Called pharm to check on PA bc never heard from ins. Pharm reported pt has been p/up Rx for co-pay of $25, so PA was approved.

## 2014-06-03 ENCOUNTER — Encounter: Payer: Self-pay | Admitting: Neurology

## 2014-06-07 ENCOUNTER — Ambulatory Visit: Payer: Managed Care, Other (non HMO) | Admitting: Neurology

## 2014-06-07 ENCOUNTER — Telehealth: Payer: Self-pay | Admitting: Neurology

## 2014-06-07 NOTE — Telephone Encounter (Signed)
Patient arrived too late for appointment today, (06/07/14)

## 2014-06-10 ENCOUNTER — Encounter: Payer: Self-pay | Admitting: Neurology

## 2014-07-11 ENCOUNTER — Other Ambulatory Visit: Payer: Self-pay | Admitting: Physician Assistant

## 2014-07-28 ENCOUNTER — Ambulatory Visit (INDEPENDENT_AMBULATORY_CARE_PROVIDER_SITE_OTHER): Payer: Managed Care, Other (non HMO) | Admitting: Family Medicine

## 2014-07-28 VITALS — BP 118/62 | HR 59 | Temp 99.3°F | Resp 18 | Ht 70.25 in | Wt 223.4 lb

## 2014-07-28 DIAGNOSIS — Z131 Encounter for screening for diabetes mellitus: Secondary | ICD-10-CM

## 2014-07-28 DIAGNOSIS — R05 Cough: Secondary | ICD-10-CM

## 2014-07-28 DIAGNOSIS — R059 Cough, unspecified: Secondary | ICD-10-CM

## 2014-07-28 DIAGNOSIS — Z125 Encounter for screening for malignant neoplasm of prostate: Secondary | ICD-10-CM

## 2014-07-28 DIAGNOSIS — Z23 Encounter for immunization: Secondary | ICD-10-CM

## 2014-07-28 DIAGNOSIS — I10 Essential (primary) hypertension: Secondary | ICD-10-CM

## 2014-07-28 DIAGNOSIS — E785 Hyperlipidemia, unspecified: Secondary | ICD-10-CM

## 2014-07-28 LAB — POCT URINALYSIS DIPSTICK
Bilirubin, UA: NEGATIVE
Glucose, UA: NEGATIVE
Leukocytes, UA: NEGATIVE
NITRITE UA: NEGATIVE
SPEC GRAV UA: 1.025
Urobilinogen, UA: 1
pH, UA: 6

## 2014-07-28 MED ORDER — BENZONATATE 100 MG PO CAPS: 100.0000 mg | ORAL_CAPSULE | Freq: Three times a day (TID) | ORAL | Status: DC | PRN

## 2014-07-28 MED ORDER — LISINOPRIL-HYDROCHLOROTHIAZIDE 20-12.5 MG PO TABS: 1.0000 | ORAL_TABLET | Freq: Two times a day (BID) | ORAL | Status: DC

## 2014-07-28 MED ORDER — AMLODIPINE BESYLATE 10 MG PO TABS: 10.0000 mg | ORAL_TABLET | Freq: Every day | ORAL | Status: DC

## 2014-07-28 MED ORDER — ROSUVASTATIN CALCIUM 20 MG PO TABS: 20.0000 mg | ORAL_TABLET | Freq: Every day | ORAL | Status: DC

## 2014-07-28 MED ORDER — MUCINEX DM MAXIMUM STRENGTH 60-1200 MG PO TB12: 1.0000 | ORAL_TABLET | Freq: Two times a day (BID) | ORAL | Status: DC

## 2014-07-28 NOTE — Patient Instructions (Signed)

## 2014-07-28 NOTE — Progress Notes (Signed)
Subjective:    Patient ID: Seth Carson, male    DOB: [birth year], 54 y.o.   MRN: [id number]  07/28/2014  Cough and Medication Refill   HPI This 54 y.o. male presents for follow-up for the following:  1.  HTN:  Patient reports good compliance with medication, good tolerance to medication, and good symptom control.  Not checking Bp at home due to stressors.  Taking ASA  daily.   Denies CP/palp/SOB/leg swelling.   2. Hyperlipidemia: Patient reports good compliance with medication, good tolerance to medication, and good symptom control.   Denies HA/dizziness/focal weakness/paresthesias.  3.  OSA:  Referred last year for OSA; prescribed CPAP machine.  Still sleeping better and no longer drowsy during the day.  Not 100% better; not sleeping as hard as when first placed on CPAP.  Has 2-3 nnighttime awakening.  Wakes up at 7:00am every morning energized.   4. Colon cancer screening:  Unable to schedule appointment due to work schedule after last visit.    5.  Cough:  Onset 3 weeks ago.  Got sick initially and took entire week off; fatigue and laying in bed for two days.  Really tired with cough with sputum.  After two days, felt better but still had cough.  Decreased cough but still coughing some especially when gets hot.  Having hacky cough with sputum.  No SOB with cough unless having coughing fits.  Likes to walk [facility]; has been harder to do since being sick.    6.  No flu vaccine but agreeable.   Review of Systems  Constitutional: Negative for fever, chills, diaphoresis, activity change, appetite change and fatigue.  HENT: Negative for congestion, ear pain, postnasal drip, rhinorrhea, sore throat, trouble swallowing and voice change.   Respiratory: Positive for cough and shortness of breath. Negative for wheezing.   Cardiovascular: Negative for chest pain, palpitations and leg swelling.  Gastrointestinal: Negative for nausea, vomiting, abdominal pain, diarrhea, constipation,  blood in stool, abdominal distention and anal bleeding.  Endocrine: Negative for cold intolerance, heat intolerance, polydipsia, polyphagia and polyuria.  Skin: Negative for color change, rash and wound.  Neurological: Negative for dizziness, tremors, seizures, syncope, facial asymmetry, speech difficulty, weakness, light-headedness, numbness and headaches.  Psychiatric/Behavioral: Negative for sleep disturbance and dysphoric mood. The patient is not nervous/anxious.     Past Medical History  Diagnosis Date  . Hypertension   . Hyperlipidemia    History reviewed. No pertinent past surgical history. No Known Allergies  History   Social History  . Marital Status: Single    Spouse Name: n/a    Number of Children: 0  . Years of Education: 12   Occupational History  . Systems developer    Social History Main Topics  . Smoking status: Never Smoker   . Smokeless tobacco: Not on file  . Alcohol Use: No  . Drug Use: No  . Sexual Activity: Not on file   Other Topics Concern  . Not on file   Social History Narrative   Marital status: single;       Children:  None       Lives with his mother "she won't let me leave."      Employment:  [employer] x 21 years; repairman; happy      Tobacco: none      Alcohol:  none      Exercise:  Sporadic.  Some walking.   Family History  Problem Relation Age of Onset  . Emphysema Father   .  Cancer Father 24    lung cancer with liver mets; prostate cancer  . Sleep apnea Brother   . Hypertension Mother   . Heart disease Brother 14    CABG  . Hypertension Brother   . Cancer Maternal Grandmother   . Hypertension Maternal Grandfather   . Stroke Maternal Grandfather   . Cancer Paternal Grandfather        Objective:    BP 118/62 mmHg  Pulse 59  Temp(Src) 99.3 F (37.4 C) (Oral)  Resp 18  Ht 5' 10.25" (1.784 m)  Wt 223 lb 6.4 oz (101.334 kg)  BMI 31.84 kg/m2  SpO2 94% Physical Exam  Constitutional: He is oriented to person,  place, and time. He appears well-developed and well-nourished. No distress.  HENT:  Head: Normocephalic and atraumatic.  Right Ear: External ear normal.  Left Ear: External ear normal.  Nose: Nose normal.  Mouth/Throat: Oropharynx is clear and moist.  Eyes: Conjunctivae and EOM are normal. Pupils are equal, round, and reactive to light.  Neck: Normal range of motion. Neck supple. Carotid bruit is not present. No thyromegaly present.  Cardiovascular: Normal rate, regular rhythm, normal heart sounds and intact distal pulses.  Exam reveals no gallop and no friction rub.   No murmur heard. Pulmonary/Chest: Effort normal and breath sounds normal. He has no wheezes. He has no rales.  Abdominal: Soft. Bowel sounds are normal. He exhibits no distension and no mass. There is no tenderness. There is no rebound and no guarding.  Lymphadenopathy:    He has no cervical adenopathy.  Neurological: He is alert and oriented to person, place, and time. No cranial nerve deficit.  Skin: Skin is warm and dry. No rash noted. He is not diaphoretic.  Psychiatric: He has a normal mood and affect. His behavior is normal.  Nursing note and vitals reviewed.  Results for orders placed or performed in visit on 07/28/14  POCT urinalysis dipstick  Result Value Ref Range   Color, UA yellow    Clarity, UA clear    Glucose, UA neg    Bilirubin, UA neg    Ketones, UA trace    Spec Grav, UA 1.025    Blood, UA trace    pH, UA 6.0    Protein, UA trace    Urobilinogen, UA 1.0    Nitrite, UA neg    Leukocytes, UA Negative    INFLUENZA VACCINE ADMINISTERED.    Assessment & Plan:   1. Essential hypertension, benign   2. Hyperlipidemia   3. Screening for diabetes mellitus   4. Screening for prostate cancer   5. Flu vaccine need   6. Essential hypertension   7. Cough    1. HTN: controlled but persistently bradycardic; wean off of Bystolic; increase Amlodipine. Continue current dose of Lisinopril-HCTZ. Obtain  labs.  Check BP weekly with change in medication; return to office in six months. 2.  Hyperlipidemia: controlled; obtain labs; refill of Crestor  daily provided. 3.  Screening diabetes: obtain glucose, HgbA1c. 4. Screening prostate cancer: obtain PSA.  Pt declined prostate exam. 5.  Cough: New onset in past two weeks; improving; rx for Mucinex DM and Tessalon provided; if no improvement in two weeks, call office for abx. 6.  S/p flu vaccine. 7. Health Maintenance: encourage CPE with PCP in six months. 8. Colon cancer screening: recommend colonoscopy; pt to consider rescheduling.   Meds ordered this encounter  Medications  . rosuvastatin (CRESTOR) 20 MG tablet    Sig: Take 1  tablet (20 mg total) by mouth daily.    Dispense:  30 tablet    Refill:  12  . amLODipine (NORVASC) 10 MG tablet    Sig: Take 1 tablet (10 mg total) by mouth daily.    Dispense:  30 tablet    Refill:  5  . lisinopril-hydrochlorothiazide (PRINZIDE,ZESTORETIC) 20-12.5 MG per tablet    Sig: Take 1 tablet by mouth 2 (two) times daily.    Dispense:  60 tablet    Refill:  5  . Dextromethorphan-Guaifenesin (MUCINEX DM MAXIMUM STRENGTH) 60-1200 MG TB12    Sig: Take 1 tablet by mouth every 12 (twelve) hours.    Dispense:  20 each    Refill:  0  . benzonatate (TESSALON) 100 MG capsule    Sig: Take 1-2 capsules (100-200 mg total) by mouth 3 (three) times daily as needed for cough.    Dispense:  40 capsule    Refill:  0    No Follow-up on file.     Seth Carson, M.D. [facility]  [place] [address] [place], [place]  [id number] [phone] phone [phone] fax

## 2014-07-29 LAB — COMPREHENSIVE METABOLIC PANEL
ALT: 27 U/L (ref 0–53)
AST: 19 U/L (ref 0–37)
Albumin: 4.5 g/dL (ref 3.5–5.2)
Alkaline Phosphatase: 51 U/L (ref 39–117)
BUN: 13 mg/dL (ref 6–23)
CHLORIDE: 103 meq/L (ref 96–112)
CO2: 28 meq/L (ref 19–32)
Calcium: 9.8 mg/dL (ref 8.4–10.5)
Creat: 1.1 mg/dL (ref 0.50–1.35)
Glucose, Bld: 95 mg/dL (ref 70–99)
POTASSIUM: 3.7 meq/L (ref 3.5–5.3)
SODIUM: 145 meq/L (ref 135–145)
TOTAL PROTEIN: 7.6 g/dL (ref 6.0–8.3)
Total Bilirubin: 0.6 mg/dL (ref 0.2–1.2)

## 2014-07-29 LAB — HEMOGLOBIN A1C
Hgb A1c MFr Bld: 5.9 % — ABNORMAL HIGH (ref <5.7)
Mean Plasma Glucose: 123 mg/dL — ABNORMAL HIGH (ref <117)

## 2014-07-29 LAB — CBC WITH DIFFERENTIAL/PLATELET
BASOS PCT: 1 % (ref 0–1)
Basophils Absolute: 0.1 10*3/uL (ref 0.0–0.1)
Eosinophils Absolute: 0.2 10*3/uL (ref 0.0–0.7)
Eosinophils Relative: 3 % (ref 0–5)
HEMATOCRIT: 42.7 % (ref 39.0–52.0)
Hemoglobin: 14.7 g/dL (ref 13.0–17.0)
LYMPHS PCT: 46 % (ref 12–46)
Lymphs Abs: 3.3 10*3/uL (ref 0.7–4.0)
MCH: 28.6 pg (ref 26.0–34.0)
MCHC: 34.4 g/dL (ref 30.0–36.0)
MCV: 83.1 fL (ref 78.0–100.0)
MPV: 10.8 fL (ref 8.6–12.4)
Monocytes Absolute: 0.6 10*3/uL (ref 0.1–1.0)
Monocytes Relative: 9 % (ref 3–12)
Neutro Abs: 2.9 10*3/uL (ref 1.7–7.7)
Neutrophils Relative %: 41 % — ABNORMAL LOW (ref 43–77)
Platelets: 274 10*3/uL (ref 150–400)
RBC: 5.14 MIL/uL (ref 4.22–5.81)
RDW: 13.3 % (ref 11.5–15.5)
WBC: 7.1 10*3/uL (ref 4.0–10.5)

## 2014-07-29 LAB — TSH: TSH: 1.498 u[IU]/mL (ref 0.350–4.500)

## 2014-07-29 LAB — LIPID PANEL
CHOL/HDL RATIO: 4.3 ratio
Cholesterol: 150 mg/dL (ref 0–200)
HDL: 35 mg/dL — ABNORMAL LOW (ref >39)
LDL Cholesterol: 63 mg/dL (ref 0–99)
Triglycerides: 262 mg/dL — ABNORMAL HIGH (ref <150)
VLDL: 52 mg/dL — ABNORMAL HIGH (ref 0–40)

## 2014-07-30 LAB — PSA: PSA: 1.26 ng/mL (ref <=4.00)

## 2014-08-04 NOTE — Progress Notes (Signed)
Scheduled a CPE with Dr. Neva SeatGreene on 02/06/15 at 230

## 2014-08-12 ENCOUNTER — Ambulatory Visit (INDEPENDENT_AMBULATORY_CARE_PROVIDER_SITE_OTHER): Payer: Managed Care, Other (non HMO) | Admitting: Neurology

## 2014-08-12 ENCOUNTER — Encounter: Payer: Self-pay | Admitting: Neurology

## 2014-08-12 VITALS — BP 111/70 | HR 72 | Temp 99.0°F | Ht 70.0 in | Wt 222.0 lb

## 2014-08-12 DIAGNOSIS — G4731 Primary central sleep apnea: Secondary | ICD-10-CM

## 2014-08-12 DIAGNOSIS — Z9989 Dependence on other enabling machines and devices: Secondary | ICD-10-CM

## 2014-08-12 DIAGNOSIS — G4733 Obstructive sleep apnea (adult) (pediatric): Secondary | ICD-10-CM

## 2014-08-12 NOTE — Patient Instructions (Signed)
Please continue using your CPAP regularly. While your insurance requires that you use CPAP at least 4 hours each night on 70% of the nights, I recommend, that you not skip any nights and use it throughout the night if you can. Getting used to CPAP and staying with the treatment long term does take time and patience and discipline. Untreated obstructive sleep apnea when it is moderate to severe can have an adverse impact on cardiovascular health and raise her risk for heart disease, arrhythmias, hypertension, congestive heart failure, stroke and diabetes. Untreated obstructive sleep apnea causes sleep disruption, nonrestorative sleep, and sleep deprivation. This can have an impact on your day to day functioning and cause daytime sleepiness and impairment of cognitive function, memory loss, mood disturbance, and problems focussing. Using CPAP regularly can improve these symptoms.  Keep up the good work! I will see you back in 12 months for sleep apnea check up, since you are doing well.

## 2014-08-12 NOTE — Progress Notes (Signed)
Subjective:    Patient ID: Seth Carson is a 54 y.o. male.  HPI     Interim history:   Seth Carson is a very pleasant 54 year old right-handed gentleman with an underlying medical history of hyperlipidemia, bradycardia, obesity and hypertension, who presents for followup consultation of his complex sleep apnea. He is unaccompanied today. I last saw him on 12/06/2013, at which time he was fully compliant with BiPAP therapy. He reported feeling much better, much less tired during the day and he had adjusted to the treatment well. He had some worsening air leak from the mask.   Today, I reviewed his compliance data from 07/11/2014 through 08/09/2014 which is a total of 30 days during which time he used his machine every night except for 1 night with percent used days greater than 4 hours of 80%, indicating very good compliance with an average usage of 5 hours and 41 minutes and residual AHI borderline at 5.4 per hour and leak acceptable with the 95th percentile of 12 L/m at the current setting of 15/10 cm with a rate of 12/min.  Today, he reports doing well. He has had a cough for a few weeks. He needs new supplies for his PAP. He feels well with his BiPAP and has no new complaints. He does admit to not always using it through the night when sometimes he falls asleep while watching TV. One time he was babysitting at a friend's house and forgot to take his machine with him. He is no longer on Bystolic. Otherwise, he has had no recent changes in his medications or his medical history.  I first met him on 07/09/2013, at which time he reported snoring, mouth dryness, daytime somnolence and witnessed apneas. I invited him back for sleep study but his insurance mandated that he have a home sleep test. He had a home sleep test which showed primary central sleep apnea syndrome with 46% of the events being central in nature and 35% of the events were obstructive in nature for total AHI estimated at 49 per  hour. Desaturation nadir was 65% during that home sleep study. He was invited back for a full night titration study with positive airway pressure. He had a Pap titration study on 09/14/2013 and I went over his home sleep test results as well as titration test results in detail today. His sleep efficiency during the study was 90.6% with a latency to sleep of 19 minutes and wake after sleep onset of 21 minutes with mild to moderate sleep fragmentation noted. His arousal index was mildly elevated. He had a normal percentage of stage I sleep, an increased percentage of stage II sleep, 3.5% of slow-wave sleep and an increased percentage of REM sleep at 28.1% with a reduced REM latency. He had no significant periodic leg movements and rare PVCs were noted. He was initiated on CPAP first utilizing a large nasal mask which he chose over the nasal pillows mask. Almost immediately after CPAP was started he exhibited central apneas. CPAP was gradually titrated from 5-8 cm with ongoing central respiratory events. He was therefore switched to BiPAP at a pressure of 10/6 cm and BiPAP was titrated to 11/7 cm with persistent central events and the patient was therefore switched to BiPAP ST and titrated to a final pressure of 16/12 cm with a rate of 12. Supine REM sleep was achieved. Residual AHI was 2.9 per hour. Oxygen saturation was 93% on average with a nadir of 80%. I prescribed BiPAP ST at  a pressure of 15/10 with a rate of 12 for him. I reviewed the patient's BiPAP compliance data from 10/11/2013 to 11/09/2013, which is a total of 30 days, during which time the patient used PAP every day. The average usage for all days was 6 hours and 46 minutes. The percent used days greater than 4 hours was 100 %, indicating superb compliance. The residual AHI was mildly suboptimal at 6.4 per hour, indicating a mildly suboptimal treatment pressure of 15/10 cwp with a backup rate of 12 breaths per minute. Air leak from the mask was low at  11.3 L per minute at the 95th percentile.  I reviewed his compliance data from 11/05/2013 through 12/04/2013 which is a total of 30 days during which time he used his BiPAP every night with 100% compliance, average usage of 6 hours and 36 minutes, residual AHI slightly suboptimal at 6.5 and leak acceptable at 20.  I reviewed his compliance data from 05/04/2014 through 06/02/2014 which is a total of 30 days during which time he used his machine 27 days with percent used days greater than 4 hours at 87%, indicating very good compliance with an average usage of 5 hours of 9 minutes. Residual AHI borderline elevated at 6 per hour and leak acceptable with the 95th percentile at 10.3 L/m. He continues to be on BiPAP ST 15/10 cm with a rate of 12.  His Past Medical History Is Significant For: Past Medical History  Diagnosis Date  . Hypertension   . Hyperlipidemia     His Past Surgical History Is Significant For: No past surgical history on file.  His Family History Is Significant For: Family History  Problem Relation Age of Onset  . Emphysema Father   . Cancer Father 45    lung cancer with liver mets; prostate cancer  . Sleep apnea Brother   . Hypertension Mother   . Heart disease Brother 103    CABG  . Hypertension Brother   . Cancer Maternal Grandmother   . Hypertension Maternal Grandfather   . Stroke Maternal Grandfather   . Cancer Paternal Grandfather     His Social History Is Significant For: History   Social History  . Marital Status: Single    Spouse Name: n/a  . Number of Children: 0  . Years of Education: 12   Occupational History  . Scientist, research (physical sciences)    Social History Main Topics  . Smoking status: Never Smoker   . Smokeless tobacco: Not on file  . Alcohol Use: No  . Drug Use: No  . Sexual Activity: Not on file   Other Topics Concern  . None   Social History Narrative   Marital status: single;       Children:  None       Lives with his mother "she won't let  me leave."      Employment:  [employer] x 21 years; repairman; happy      Tobacco: none      Alcohol:  none      Exercise:  Sporadic.  Some walking.    His Allergies Are:  No Known Allergies:   His Current Medications Are:  Outpatient Encounter Prescriptions as of 08/12/2014  Medication Sig  . amLODipine (NORVASC) 10 MG tablet Take 1 tablet (10 mg total) by mouth daily.  Marland Kitchen lisinopril-hydrochlorothiazide (PRINZIDE,ZESTORETIC) 20-12.5 MG per tablet Take 1 tablet by mouth 2 (two) times daily.  . rosuvastatin (CRESTOR) 20 MG tablet Take 1 tablet (20 mg total)  by mouth daily.  . [DISCONTINUED] benzonatate (TESSALON) 100 MG capsule Take 1-2 capsules (100-200 mg total) by mouth 3 (three) times daily as needed for cough.  . [DISCONTINUED] Dextromethorphan-Guaifenesin (MUCINEX DM MAXIMUM STRENGTH) 60-1200 MG TB12 Take 1 tablet by mouth every 12 (twelve) hours.  . [DISCONTINUED] nebivolol (BYSTOLIC) 5 MG tablet TAKE ONE TABLET BY MOUTH ONCE DAILY.  "NEEDS OFFICE VISIT FOR ADDITIONAL REFILLS"  :  Review of Systems:  Out of a complete 14 point review of systems, all are reviewed and negative with the exception of these symptoms as listed below:   Review of Systems  Respiratory: Positive for cough.     Objective:  Neurologic Exam  Physical Exam Physical Examination:   Filed Vitals:   08/12/14 1145  BP: 111/70  Pulse: 72  Temp: 99 F (37.2 C)     General Examination: The patient is a very pleasant 54 y.o. male in no acute distress. He appears well-developed and well-nourished and well groomed.   HEENT: Normocephalic, atraumatic, pupils are equal, round and reactive to light and accommodation. Funduscopic exam is normal with sharp disc margins noted. Extraocular tracking is good without limitation to gaze excursion or nystagmus noted. Normal smooth pursuit is noted. Hearing is grossly intact. Face is symmetric with normal facial animation and normal facial sensation. Speech is clear  with no dysarthria noted. There is no hypophonia. There is no lip, neck/head, jaw or voice tremor. Neck is supple with full range of passive and active motion. There are no carotid bruits on auscultation. Oropharynx exam reveals: mild mouth dryness, adequate dental hygiene and marked airway crowding, due to large tongue, and large uvula, larger tonsils. Mallampati is class II. Tongue protrudes centrally and palate elevates symmetrically.   Chest: Clear to auscultation without wheezing, rhonchi or crackles noted.  Heart: S1+S2+0, regular and normal without murmurs, rubs or gallops noted.   Abdomen: Soft, non-tender and non-distended with normal bowel sounds appreciated on auscultation.  Extremities: There is no pitting edema in the distal lower extremities bilaterally. Pedal pulses are intact.  Skin: Warm and dry without trophic changes noted. There are no varicose veins.  Musculoskeletal: exam reveals no obvious joint deformities, tenderness or joint swelling or erythema.   Neurologically:  Mental status: The patient is awake, alert and oriented in all 4 spheres. His memory, attention, language and knowledge are appropriate. There is no aphasia, agnosia, apraxia or anomia. Speech is clear with normal prosody and enunciation. Thought process is linear. Mood is congruent and affect is normal.  Cranial nerves are as described above under HEENT exam. In addition, shoulder shrug is normal with equal shoulder height noted. Motor exam: Normal bulk, strength and tone is noted. There is no drift, tremor or rebound. Romberg is negative. Reflexes are 2+ throughout. Toes are downgoing bilaterally. Fine motor skills are intact with normal finger taps, normal hand movements, normal rapid alternating patting, normal foot taps and normal foot agility.  Cerebellar testing shows no dysmetria or intention tremor on finger to nose testing. Heel to shin is unremarkable bilaterally. There is no truncal or gait ataxia.   Sensory exam is intact to light touch, pinprick, vibration, temperature sense in the upper and lower extremities.  Gait, station and balance are unremarkable. No veering to one side is noted. No leaning to one side is noted. Posture is age-appropriate and stance is narrow based. No problems turning are noted. He turns en bloc. Tandem walk is unremarkable. Intact toe and heel stance is noted.  Assessment and Plan:   In summary, Seth Carson is a very pleasant 54 year old male with an underlying medical history of hyperlipidemia, bradycardia, obesity and hypertension, who presents for followup consultation of his complex sleep apnea with a primary central apnea component. He has been established on BiPAP treatment at a pressure of 15/10 with a rate of 12/min. His physical exam continues to be stable and he has had good results with the use of BiPAP, and good tolerance of the pressure and mask. I reviewed his home sleep study results in the past with him and we talked about the BiPAP titration sleep study results as well today. I reviewed the compliance data with the patient and recommended him for being compliant. His AHI has improved from 6.5 per hour to 5.4 per hour and with consistent use it may even be better. His leak is acceptable. I renewed his PAP supplies today. I asked him to make sure that he does not skip any nights. We talked about maintaining a healthy lifestyle in general. I encouraged the patient to eat healthy, exercise daily and keep well hydrated, to keep a scheduled bedtime and wake time routine, to not skip any meals and eat healthy snacks in between meals and to have protein with every meal. I stressed the importance of regular exercise and of using his BiPAP machine every time he sleeps. Since he is doing well I can see him back on a yearly basis at this juncture. I answered all his questions today and he was in agreement. I encouraged him to call with any interim questions,  concerns, problems or updates.

## 2014-12-15 ENCOUNTER — Ambulatory Visit (INDEPENDENT_AMBULATORY_CARE_PROVIDER_SITE_OTHER): Payer: Managed Care, Other (non HMO) | Admitting: Emergency Medicine

## 2014-12-15 VITALS — BP 136/78 | HR 67 | Temp 97.6°F | Resp 17 | Ht 70.5 in | Wt 224.4 lb

## 2014-12-15 DIAGNOSIS — K088 Other specified disorders of teeth and supporting structures: Secondary | ICD-10-CM | POA: Diagnosis not present

## 2014-12-15 DIAGNOSIS — K0889 Other specified disorders of teeth and supporting structures: Secondary | ICD-10-CM

## 2014-12-15 MED ORDER — HYDROCODONE-ACETAMINOPHEN 5-325 MG PO TABS: 1.0000 | ORAL_TABLET | ORAL | Status: DC | PRN

## 2014-12-15 MED ORDER — CEPHALEXIN 500 MG PO CAPS: 500.0000 mg | ORAL_CAPSULE | Freq: Four times a day (QID) | ORAL | Status: DC

## 2014-12-15 NOTE — Progress Notes (Signed)
Subjective:  Patient ID: Seth Carson, male    DOB: 1961/02/21  Age: 54 y.o. MRN: [id number]  CC: Dental Pain   HPI Seth Carson presents  with dental pain. He has a 3 day history of pain in his left lower jaw. He said that he had a root canal performed in the tooth is broken. As no fever or chills. Has moderate swelling of the gingiva. His had no improvement with over-the-counter medication.  History Seth Carson has a past medical history of Hypertension and Hyperlipidemia.   He has no past surgical history on file.   His  family history includes Cancer in his maternal grandmother and paternal grandfather; Cancer (age of onset: 45) in his father; Emphysema in his father; Heart disease (age of onset: 81) in his brother; Hypertension in his brother, maternal grandfather, and mother; Sleep apnea in his brother; Stroke in his maternal grandfather.  He   reports that he has never smoked. He does not have any smokeless tobacco history on file. He reports that he does not drink alcohol or use illicit drugs.  Outpatient Prescriptions Prior to Visit  Medication Sig Dispense Refill  . amLODipine (NORVASC) 10 MG tablet Take 1 tablet (10 mg total) by mouth daily. 30 tablet 5  . lisinopril-hydrochlorothiazide (PRINZIDE,ZESTORETIC) 20-12.5 MG per tablet Take 1 tablet by mouth 2 (two) times daily. 60 tablet 5  . rosuvastatin (CRESTOR) 20 MG tablet Take 1 tablet (20 mg total) by mouth daily. 30 tablet 12   No facility-administered medications prior to visit.    History   Social History  . Marital Status: Single    Spouse Name: n/a  . Number of Children: 0  . Years of Education: 12   Occupational History  . Systems developer    Social History Main Topics  . Smoking status: Never Smoker   . Smokeless tobacco: Not on file  . Alcohol Use: No  . Drug Use: No  . Sexual Activity: Not on file   Other Topics Concern  . None   Social History Narrative   Marital status: single;    Children:  None       Lives with his mother "she won't let me leave."      Employment:  [employer] x 21 years; repairman; happy      Tobacco: none      Alcohol:  none      Exercise:  Sporadic.  Some walking.     Review of Systems  Constitutional: Negative for fever, chills and appetite change.  HENT: Negative for congestion, ear pain, postnasal drip, sinus pressure and sore throat.   Eyes: Negative for pain and redness.  Respiratory: Negative for cough, shortness of breath and wheezing.   Cardiovascular: Negative for leg swelling.  Gastrointestinal: Negative for nausea, vomiting, abdominal pain, diarrhea, constipation and blood in stool.  Endocrine: Negative for polyuria.  Genitourinary: Negative for dysuria, urgency, frequency and flank pain.  Musculoskeletal: Negative for gait problem.  Skin: Negative for rash.  Neurological: Negative for weakness and headaches.  Psychiatric/Behavioral: Negative for confusion and decreased concentration. The patient is not nervous/anxious.     Objective:  BP 136/78 mmHg  Pulse 67  Temp(Src) 97.6 F (36.4 C) (Oral)  Resp 17  Ht 5' 10.5" (1.791 m)  Wt 224 lb 6.4 oz (101.787 kg)  BMI 31.73 kg/m2  SpO2 98%  Physical Exam  Constitutional: He is oriented to person, place, and time. He appears well-developed and well-nourished.  HENT:  Head: Normocephalic  and atraumatic.  Mouth/Throat: Dental caries present.  Eyes: Conjunctivae are normal. Pupils are equal, round, and reactive to light.  Pulmonary/Chest: Effort normal.  Musculoskeletal: He exhibits no edema.  Neurological: He is alert and oriented to person, place, and time.  Skin: Skin is dry.  Psychiatric: He has a normal mood and affect. His behavior is normal. Thought content normal.   on his right mandible there is a badly carious premolar with swelling of the gingiva is not soft and fluctuant as an abscess but rather firm. He said this is rather tender. Adjacent to a badly decayed   tooth    Assessment & Plan:   Seth Carson was seen today for dental pain.  Diagnoses and all orders for this visit:  Pain, dental  Other orders -     cephALEXin (KEFLEX) 500 MG capsule; Take 1 capsule (500 mg total) by mouth 4 (four) times daily. -     HYDROcodone-acetaminophen (NORCO) 5-325 MG per tablet; Take 1 tablet by mouth every 4 (four) hours as needed.   I am having [name] start on cephALEXin and HYDROcodone-acetaminophen. I am also having him maintain his rosuvastatin, amLODipine, and lisinopril-hydrochlorothiazide.  Meds ordered this encounter  Medications  . cephALEXin (KEFLEX) 500 MG capsule    Sig: Take 1 capsule (500 mg total) by mouth 4 (four) times daily.    Dispense:  40 capsule    Refill:  0  . HYDROcodone-acetaminophen (NORCO) 5-325 MG per tablet    Sig: Take 1 tablet by mouth every 4 (four) hours as needed.    Dispense:  20 tablet    Refill:  0   I suggested that he communicate with his insurance company to obtain name of the dentist thatwith insurance in this area.  Appropriate red flag conditions were discussed with the patient as well as actions that should be taken.  Patient expressed his understanding.  Follow-up: Return if symptoms worsen or fail to improve.  Carmelina Dane, MD

## 2014-12-15 NOTE — Patient Instructions (Signed)

## 2015-01-27 ENCOUNTER — Other Ambulatory Visit: Payer: Self-pay | Admitting: Family Medicine

## 2015-02-06 ENCOUNTER — Ambulatory Visit (INDEPENDENT_AMBULATORY_CARE_PROVIDER_SITE_OTHER): Payer: Managed Care, Other (non HMO) | Admitting: Family Medicine

## 2015-02-06 ENCOUNTER — Encounter: Payer: Self-pay | Admitting: Family Medicine

## 2015-02-06 VITALS — BP 120/74 | HR 67 | Temp 98.1°F | Resp 16 | Ht 70.0 in | Wt 220.0 lb

## 2015-02-06 DIAGNOSIS — Z0000 Encounter for general adult medical examination without abnormal findings: Secondary | ICD-10-CM

## 2015-02-06 DIAGNOSIS — R739 Hyperglycemia, unspecified: Secondary | ICD-10-CM | POA: Diagnosis not present

## 2015-02-06 DIAGNOSIS — E785 Hyperlipidemia, unspecified: Secondary | ICD-10-CM

## 2015-02-06 DIAGNOSIS — I10 Essential (primary) hypertension: Secondary | ICD-10-CM | POA: Diagnosis not present

## 2015-02-06 LAB — LIPID PANEL
Cholesterol: 124 mg/dL — ABNORMAL LOW (ref 125–200)
HDL: 39 mg/dL — ABNORMAL LOW (ref >=40)
LDL Cholesterol: 56 mg/dL (ref <130)
TRIGLYCERIDES: 144 mg/dL (ref <150)
Total CHOL/HDL Ratio: 3.2 Ratio (ref <=5.0)
VLDL: 29 mg/dL (ref <30)

## 2015-02-06 LAB — COMPLETE METABOLIC PANEL WITH GFR
ALBUMIN: 4.1 g/dL (ref 3.6–5.1)
ALK PHOS: 47 U/L (ref 40–115)
ALT: 19 U/L (ref 9–46)
AST: 17 U/L (ref 10–35)
BILIRUBIN TOTAL: 0.6 mg/dL (ref 0.2–1.2)
BUN: 15 mg/dL (ref 7–25)
CALCIUM: 9.7 mg/dL (ref 8.6–10.3)
CO2: 29 mmol/L (ref 20–31)
Chloride: 102 mmol/L (ref 98–110)
Creat: 1.1 mg/dL (ref 0.70–1.33)
GFR, EST AFRICAN AMERICAN: 89 mL/min (ref >=60)
GFR, Est Non African American: 77 mL/min (ref >=60)
Glucose, Bld: 100 mg/dL — ABNORMAL HIGH (ref 65–99)
Potassium: 3.7 mmol/L (ref 3.5–5.3)
Sodium: 142 mmol/L (ref 135–146)
TOTAL PROTEIN: 7.4 g/dL (ref 6.1–8.1)

## 2015-02-06 LAB — TSH: TSH: 2.164 u[IU]/mL (ref 0.350–4.500)

## 2015-02-06 LAB — POCT GLYCOSYLATED HEMOGLOBIN (HGB A1C): HEMOGLOBIN A1C: 5.9

## 2015-02-06 LAB — GLUCOSE, POCT (MANUAL RESULT ENTRY): POC GLUCOSE: 86 mg/dL (ref 70–99)

## 2015-02-06 MED ORDER — AMLODIPINE BESYLATE 10 MG PO TABS: 10.0000 mg | ORAL_TABLET | Freq: Every day | ORAL | Status: DC

## 2015-02-06 MED ORDER — LISINOPRIL-HYDROCHLOROTHIAZIDE 20-12.5 MG PO TABS: 1.0000 | ORAL_TABLET | Freq: Every day | ORAL | Status: DC

## 2015-02-06 MED ORDER — ROSUVASTATIN CALCIUM 20 MG PO TABS: 20.0000 mg | ORAL_TABLET | Freq: Every day | ORAL | Status: DC

## 2015-02-06 NOTE — Progress Notes (Signed)
Subjective:  This chart was scribed for Merri Ray, MD by Thea Alken, ED Scribe. This patient was seen in room 28 and the patient's care was started at 2:40 PM.   Patient ID: Seth Carson, male    DOB: Dec 05, 1960, 54 y.o.   MRN: [id number]  HPI  HPI Comments: Seth Carson is a 54 y.o. 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 Here for annual exam  Cancer screening- recommended at last visit in Feb. Plan for pt to reschedule this. Colonoscopy- he tried to schedule an appointment but reports they only had 1 day available within the 30 days they called. He has not followed up since. Prostate cancer screening- UTD  Lab Results  Component Value Date   PSA 1.26 07/28/2014   PSA 1.17 07/08/2013   Immunizations  Immunization History  Administered Date(s) Administered  . Influenza,inj,Quad PF,36+ Mos 07/08/2013, 07/28/2014  . Tdap 07/08/2013    Depression screening  Depression screen [facility] 2/9 02/06/2015 12/15/2014  Decreased Interest 0 0  Down, Depressed, Hopeless 0 0  PHQ - 2 Score 0 0   Pt denies feeling depressed.  Exercise Pt walks a couple days throughout the week. 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   Dentist- pt has not seen a dentist within the last 6 months to a year [place]- pt is seen by optho earlier this year, Feb 2016. He is seen every year. Pt wears glasses. He was told during his last visit he had an enlarged optic nerve.  Hypertension  He takes Norvasc 10 mg QD and lisinopril HCTZ 20/12.5 mg qd.  He does not check BP regularly but when he does check it is usually around 127/75. He denies dizziness, CP, and HA.  Lab Results  Component Value Date   CREATININE 1.10 07/28/2014   Hyperlipidemia He takes crestor 20 mg qd. He denies new muscle aches with medications.   Hepatic Function Latest Ref  Rng 07/28/2014 07/08/2013 06/21/2012  Total Protein 6.0 - 8.3 g/dL 7.6 7.4 7.4  Albumin 3.5 - 5.2 g/dL 4.5 4.2 4.3  AST 0 - 37 U/L 19 19 16   ALT 0 - 53 U/L 27 20 20   Alk Phosphatase 39 - 117 U/L 51 47 41  Total Bilirubin 0.2 - 1.2 mg/dL 0.6 0.9 0.7   Lab Results  Component Value Date   CHOL 150 07/28/2014   HDL 35* 07/28/2014   LDLCALC 63 07/28/2014   TRIG 262* 07/28/2014   CHOLHDL 4.3 07/28/2014   Was recommended he work on low fat food choices and exercise. Weight at Feb visit was 221.   OSA On BiPAP for OSA with a primary central apnea component. Most recent vist with Dr. Rexene Alberts, Feb 19. BiPaP settings 15/10 with a rate of 12 per minute. Pt states he is tolerating BiPAP setting well. Since being on the BiPAP he was able to drive to [place], stopping 4 times, but states prior to being on BiPAP he would have stopped at least 45 minutes due to being sleepy.     Hyperglycemia/ Pre DM  Lab Results  Component Value Date   HGBA1C 5.9* 07/28/2014   His weight was 221 in Feb 2016 recommended diet and weight changes. His weight is 220 today.   Patient Active Problem List   Diagnosis Date Noted  . Unspecified sleep apnea 08/17/2013  .  Bradycardia 07/09/2013  . HTN (hypertension) 06/10/2013  . Hyperlipidemia 06/10/2013   Past Medical History  Diagnosis Date  . Hypertension   . Hyperlipidemia    History reviewed. No pertinent past surgical history. No Known Allergies Prior to Admission medications   Medication Sig Start Date End Date Taking? Authorizing Provider  amLODipine (NORVASC) 10 MG tablet Take 1 tablet (10 mg total) by mouth daily. PATIENT NEEDS OFFICE VISIT FOR ADDITIONAL REFILLS 01/30/15  Yes Wardell Honour, MD  lisinopril-hydrochlorothiazide (PRINZIDE,ZESTORETIC) 20-12.5 MG per tablet Take 1 tablet by mouth 2 (two) times daily. PATIENT NEEDS OFFICE VISIT FOR ADDITIONAL REFILLS 01/30/15  Yes Wardell Honour, MD  rosuvastatin (CRESTOR) 20 MG tablet Take 1 tablet (20 mg total) by  mouth daily. 07/28/14  Yes Wardell Honour, MD   Social History   Social History  . Marital Status: Single    Spouse Name: n/a  . Number of Children: 0  . Years of Education: 12   Occupational History  . Scientist, research (physical sciences)    Social History Main Topics  . Smoking status: Never Smoker   . Smokeless tobacco: Not on file  . Alcohol Use: No  . Drug Use: No  . Sexual Activity: Not on file   Other Topics Concern  . Not on file   Social History Narrative   Marital status: single;       Children:  None       Lives with his mother "she won't let me leave."      Employment:  [employer] x 21 years; repairman; happy      Tobacco: none      Alcohol:  none      Exercise:  Sporadic.  Some walking.   Review of Systems  Constitutional: Negative for fatigue and unexpected weight change.  Eyes: Negative for visual disturbance.  Respiratory: Negative for cough, chest tightness and shortness of breath.   Cardiovascular: Negative for chest pain, palpitations and leg swelling.  Gastrointestinal: Negative for abdominal pain and blood in stool.  Neurological: Negative for dizziness, light-headedness and headaches.   13 point ROS reviewed on on pt survey. Positive apnea, but otherwise negative other than what's listed above.    Objective:   Physical Exam  Constitutional: He is oriented to person, place, and time. He appears well-developed and well-nourished.  HENT:  Head: Normocephalic and atraumatic.  Right Ear: External ear normal.  Left Ear: External ear normal.  Mouth/Throat: Oropharynx is clear and moist.  Eyes: Conjunctivae and EOM are normal. Pupils are equal, round, and reactive to light.  Neck: Normal range of motion. Neck supple. No thyromegaly present.  Cardiovascular: Normal rate, regular rhythm, normal heart sounds and intact distal pulses.   Pulmonary/Chest: Effort normal and breath sounds normal. No respiratory distress. He has no wheezes.  Abdominal: Soft. He exhibits no  distension. There is no tenderness. Hernia confirmed negative in the right inguinal area and confirmed negative in the left inguinal area.  Musculoskeletal: Normal range of motion. He exhibits no edema or tenderness.  Lymphadenopathy:    He has no cervical adenopathy.  Neurological: He is alert and oriented to person, place, and time. He has normal reflexes.  Skin: Skin is warm and dry.  Psychiatric: He has a normal mood and affect. His behavior is normal.  Vitals reviewed.   Filed Vitals:   02/06/15 1435  BP: 120/74  Pulse: 67  Temp: 98.1 F (36.7 C)  TempSrc: Oral  Resp: 16  Height: 5' 10"  (  1.778 m)  Weight: 220 lb (99.791 kg)  SpO2: 97%    Visual Acuity Screening   Right eye Left eye Both eyes  Without correction:     With correction: 20/20 20/20 20/20    Results for orders placed or performed in visit on 02/06/15  POCT glucose (manual entry)  Result Value Ref Range   POC Glucose 86 70 - 99 mg/dl  POCT glycosylated hemoglobin (Hb A1C)  Result Value Ref Range   Hemoglobin A1C 5.9     Assessment & Plan:   Seth Carson is a 54 y.o. male Annual physical exam --anticipatory guidance as below in AVS, screening labs above. Health maintenance items as above in HPI discussed/recommended as applicable.   Essential hypertension - Plan: COMPLETE METABOLIC PANEL WITH GFR, TSH, amLODipine (NORVASC) 10 MG tablet, lisinopril-hydrochlorothiazide (PRINZIDE,ZESTORETIC) 20-12.5 MG per tablet, CANCELED: POCT urinalysis dipstick, CANCELED: Microalbumin, urine  -controlled.  No med changes. CMP, TSH, lipids pending  Hyperlipidemia - Plan: COMPLETE METABOLIC PANEL WITH GFR, Lipid panel, rosuvastatin (CRESTOR) 20 MG tablet  -Lipids pending. Continue same dose of Crestor for now. Meds refilled.  Hyperglycemia - Plan: POCT glucose (manual entry), POCT glycosylated hemoglobin (Hb A1C), CANCELED: Microalbumin, urine  -Still prediabetes range. Recommended increased exercise and weight loss,  repeat testing in the next 6 months.  Meds ordered this encounter  Medications  . amLODipine (NORVASC) 10 MG tablet    Sig: Take 1 tablet (10 mg total) by mouth daily.    Dispense:  90 tablet    Refill:  1  . rosuvastatin (CRESTOR) 20 MG tablet    Sig: Take 1 tablet (20 mg total) by mouth daily.    Dispense:  90 tablet    Refill:  1  . lisinopril-hydrochlorothiazide (PRINZIDE,ZESTORETIC) 20-12.5 MG per tablet    Sig: Take 1 tablet by mouth daily.    Dispense:  90 tablet    Refill:  1   Patient Instructions  Blood sugar still in prediabetes range. Increased exercise and weight loss should help this. Repeat testing in next 6 months.  You should receive a call or letter about your lab results within the next week to 10 days.  Call gastroenterologist to schedule colonoscopy as discussed.   Keeping you healthy  Get these tests  Blood pressure- Have your blood pressure checked once a year by your healthcare provider.  Normal blood pressure is 120/80  Weight- Have your body mass index (BMI) calculated to screen for obesity.  BMI is a measure of body fat based on height and weight. You can also calculate your own BMI at [email or web address].  Cholesterol- Have your cholesterol checked every year.  Diabetes- Have your blood sugar checked regularly if you have high blood pressure, high cholesterol, have a family history of diabetes or if you are overweight.  Screening for Colon Cancer- Colonoscopy starting at age 28.  Screening may begin sooner depending on your family history and other health conditions. Follow up colonoscopy as directed by your Gastroenterologist.  Screening for Prostate Cancer- Both blood work (PSA) and a rectal exam help screen for Prostate Cancer.  Screening begins at age 70 with African-American men and at age 44 with Caucasian men.  Screening may begin sooner depending on your family history.  Take these medicines  Aspirin- One aspirin daily can help prevent  Heart disease and Stroke.  Flu shot- Every fall.  Tetanus- Every 10 years.  Zostavax- Once after the age of 30 to prevent Shingles.  Pneumonia shot- Once after the  age of 69; if you are younger than 7, ask your healthcare provider if you need a Pneumonia shot.  Take these steps  Don't smoke- If you do smoke, talk to your doctor about quitting.  For tips on how to quit, go to www.smokefree.gov or call 1-800-QUIT-NOW.  Be physically active- Exercise 5 days a week for at least 30 minutes.  If you are not already physically active start slow and gradually work up to 30 minutes of moderate physical activity.  Examples of moderate activity include walking briskly, mowing the yard, dancing, swimming, bicycling, etc.  Eat a healthy diet- Eat a variety of healthy food such as fruits, vegetables, low fat milk, low fat cheese, yogurt, lean meant, poultry, fish, beans, tofu, etc. For more information go to www.thenutritionsource.org  Drink alcohol in moderation- Limit alcohol intake to less than two drinks a day. Never drink and drive.  Dentist- Brush and floss twice daily; visit your dentist twice a year.  Depression- Your emotional health is as important as your physical health. If you're feeling down, or losing interest in things you would normally enjoy please talk to your healthcare provider.  Eye exam- Visit your eye doctor every year.  Safe sex- If you may be exposed to a sexually transmitted infection, use a condom.  Seat belts- Seat belts can save your life; always wear one.  Smoke/Carbon Monoxide detectors- These detectors need to be installed on the appropriate level of your home.  Replace batteries at least once a year.  Skin cancer- When out in the sun, cover up and use sunscreen 15 SPF or higher.  Violence- If anyone is threatening you, please tell your healthcare provider.  Living Will/ Health care power of attorney- Speak with your healthcare provider and family.    I  personally performed the services described in this documentation, which was scribed in my presence. The recorded information has been reviewed and considered, and addended by me as needed.

## 2015-02-06 NOTE — Patient Instructions (Signed)
Blood sugar still in prediabetes range. Increased exercise and weight loss should help this. Repeat testing in next 6 months.  You should receive a call or letter about your lab results within the next week to 10 days.  Call gastroenterologist to schedule colonoscopy as discussed.   Keeping you healthy  Get these tests  Blood pressure- Have your blood pressure checked once a year by your healthcare provider.  Normal blood pressure is 120/80  Weight- Have your body mass index (BMI) calculated to screen for obesity.  BMI is a measure of body fat based on height and weight. You can also calculate your own BMI at [email or web address].  Cholesterol- Have your cholesterol checked every year.  Diabetes- Have your blood sugar checked regularly if you have high blood pressure, high cholesterol, have a family history of diabetes or if you are overweight.  Screening for Colon Cancer- Colonoscopy starting at age 52.  Screening may begin sooner depending on your family history and other health conditions. Follow up colonoscopy as directed by your Gastroenterologist.  Screening for Prostate Cancer- Both blood work (PSA) and a rectal exam help screen for Prostate Cancer.  Screening begins at age 52 with African-American men and at age 98 with Caucasian men.  Screening may begin sooner depending on your family history.  Take these medicines  Aspirin- One aspirin daily can help prevent Heart disease and Stroke.  Flu shot- Every fall.  Tetanus- Every 10 years.  Zostavax- Once after the age of 52 to prevent Shingles.  Pneumonia shot- Once after the age of 52; if you are younger than 66, ask your healthcare provider if you need a Pneumonia shot.  Take these steps  Don't smoke- If you do smoke, talk to your doctor about quitting.  For tips on how to quit, go to www.smokefree.gov or call 1-800-QUIT-NOW.  Be physically active- Exercise 5 days a week for at least 30 minutes.  If you are not already  physically active start slow and gradually work up to 30 minutes of moderate physical activity.  Examples of moderate activity include walking briskly, mowing the yard, dancing, swimming, bicycling, etc.  Eat a healthy diet- Eat a variety of healthy food such as fruits, vegetables, low fat milk, low fat cheese, yogurt, lean meant, poultry, fish, beans, tofu, etc. For more information go to www.thenutritionsource.org  Drink alcohol in moderation- Limit alcohol intake to less than two drinks a day. Never drink and drive.  Dentist- Brush and floss twice daily; visit your dentist twice a year.  Depression- Your emotional health is as important as your physical health. If you're feeling down, or losing interest in things you would normally enjoy please talk to your healthcare provider.  Eye exam- Visit your eye doctor every year.  Safe sex- If you may be exposed to a sexually transmitted infection, use a condom.  Seat belts- Seat belts can save your life; always wear one.  Smoke/Carbon Monoxide detectors- These detectors need to be installed on the appropriate level of your home.  Replace batteries at least once a year.  Skin cancer- When out in the sun, cover up and use sunscreen 15 SPF or higher.  Violence- If anyone is threatening you, please tell your healthcare provider.  Living Will/ Health care power of attorney- Speak with your healthcare provider and family.

## 2015-02-22 ENCOUNTER — Encounter: Payer: Self-pay | Admitting: Family Medicine

## 2015-08-14 ENCOUNTER — Encounter: Payer: Self-pay | Admitting: Neurology

## 2015-08-14 ENCOUNTER — Ambulatory Visit (INDEPENDENT_AMBULATORY_CARE_PROVIDER_SITE_OTHER): Payer: Managed Care, Other (non HMO) | Admitting: Neurology

## 2015-08-14 VITALS — BP 139/91 | HR 64 | Resp 18 | Ht 70.0 in | Wt 219.0 lb

## 2015-08-14 DIAGNOSIS — G4731 Primary central sleep apnea: Secondary | ICD-10-CM

## 2015-08-14 DIAGNOSIS — G4733 Obstructive sleep apnea (adult) (pediatric): Secondary | ICD-10-CM | POA: Diagnosis not present

## 2015-08-14 NOTE — Patient Instructions (Signed)
Keep up the good work! I will see you back in 12 months for sleep apnea check up.   

## 2015-08-14 NOTE — Progress Notes (Signed)
Subjective:    Patient ID: Seth Carson is a 55 y.o. male.  HPI     Interim history:   Mr. Robenson is a very pleasant 55 year old right-handed gentleman with an underlying medical history of hyperlipidemia, bradycardia, obesity and hypertension, who presents for followup consultation of his complex sleep apnea. He is unaccompanied today. I last saw him on 08/12/2014, at which time he reported doing well. He was compliant with treatment. I suggested a one-year checkup.   Today, 08/14/2015: I reviewed his BiPAP ST compliance data from 07/11/2015 through 08/09/2015 which is a total of 30 days during which time he was under percent compliant, average usage of 5 hours and 59 minutes, residual AHI slightly borderline at 5.6 per hour, leak acceptable with the 95th percentile at 12.1 L/m on a pressure of 15/10 with a rate of 12.   Today, 08/14/2015: he reports doing well. Some nights he dozes off without the BiPAP ST. Sadly, he lost his GM about 2 months ago, he was her guardian. She was in a NH in [place]. She lived to be 89. He has received recent supplies.   Previously:  I saw him on 12/06/2013, at which time he was fully compliant with BiPAP therapy. He reported feeling much better, much less tired during the day and he had adjusted to the treatment well. He had some worsening air leak from the mask.   I reviewed his compliance data from 07/11/2014 through 08/09/2014 which is a total of 30 days during which time he used his machine every night except for 1 night with percent used days greater than 4 hours of 80%, indicating very good compliance with an average usage of 5 hours and 41 minutes and residual AHI borderline at 5.4 per hour and leak acceptable with the 95th percentile of 12 L/m at the current setting of 15/10 cm with a rate of 12/min.  I first met him on 07/09/2013, at which time he reported snoring, mouth dryness, daytime somnolence and witnessed apneas. I invited him back for  sleep study but his insurance mandated that he have a home sleep test. He had a home sleep test which showed primary central sleep apnea syndrome with 46% of the events being central in nature and 35% of the events were obstructive in nature for total AHI estimated at 49 per hour. Desaturation nadir was 65% during that home sleep study. He was invited back for a full night titration study with positive airway pressure. He had a Pap titration study on 09/14/2013 and I went over his home sleep test results as well as titration test results in detail today. His sleep efficiency during the study was 90.6% with a latency to sleep of 19 minutes and wake after sleep onset of 21 minutes with mild to moderate sleep fragmentation noted. His arousal index was mildly elevated. He had a normal percentage of stage I sleep, an increased percentage of stage II sleep, 3.5% of slow-wave sleep and an increased percentage of REM sleep at 28.1% with a reduced REM latency. He had no significant periodic leg movements and rare PVCs were noted. He was initiated on CPAP first utilizing a large nasal mask which he chose over the nasal pillows mask. Almost immediately after CPAP was started he exhibited central apneas. CPAP was gradually titrated from 5-8 cm with ongoing central respiratory events. He was therefore switched to BiPAP at a pressure of 10/6 cm and BiPAP was titrated to 11/7 cm with persistent central events and  the patient was therefore switched to BiPAP ST and titrated to a final pressure of 16/12 cm with a rate of 12. Supine REM sleep was achieved. Residual AHI was 2.9 per hour. Oxygen saturation was 93% on average with a nadir of 80%. I prescribed BiPAP ST at a pressure of 15/10 with a rate of 12 for him. I reviewed the patient's BiPAP compliance data from 10/11/2013 to 11/09/2013, which is a total of 30 days, during which time the patient used PAP every day. The average usage for all days was 6 hours and 46 minutes. The  percent used days greater than 4 hours was 100 %, indicating superb compliance. The residual AHI was mildly suboptimal at 6.4 per hour, indicating a mildly suboptimal treatment pressure of 15/10 cwp with a backup rate of 12 breaths per minute. Air leak from the mask was low at 11.3 L per minute at the 95th percentile.  I reviewed his compliance data from 11/05/2013 through 12/04/2013 which is a total of 30 days during which time he used his BiPAP every night with 100% compliance, average usage of 6 hours and 36 minutes, residual AHI slightly suboptimal at 6.5 and leak acceptable at 20.  I reviewed his compliance data from 05/04/2014 through 06/02/2014 which is a total of 30 days during which time he used his machine 27 days with percent used days greater than 4 hours at 87%, indicating very good compliance with an average usage of 5 hours of 9 minutes. Residual AHI borderline elevated at 6 per hour and leak acceptable with the 95th percentile at 10.3 L/m. He continues to be on BiPAP ST 15/10 cm with a rate of 12.  His Past Medical History Is Significant For: Past Medical History  Diagnosis Date  . Hypertension   . Hyperlipidemia     His Past Surgical History Is Significant For: No past surgical history on file.  His Family History Is Significant For: Family History  Problem Relation Age of Onset  . Emphysema Father   . Cancer Father 49    lung cancer with liver mets; prostate cancer  . Hypertension Father   . Sleep apnea Brother   . Hypertension Mother   . Heart disease Brother 63    CABG  . Hypertension Brother   . Cancer Maternal Grandmother   . Hypertension Maternal Grandfather   . Stroke Maternal Grandfather   . Cancer Paternal Grandfather   . Hypertension Sister     His Social History Is Significant For: Social History   Social History  . Marital Status: Single    Spouse Name: n/a  . Number of Children: 0  . Years of Education: 12   Occupational History  . Control and instrumentation engineer    Social History Main Topics  . Smoking status: Never Smoker   . Smokeless tobacco: None  . Alcohol Use: No  . Drug Use: No  . Sexual Activity: Not Asked   Other Topics Concern  . None   Social History Narrative   Marital status: single;       Children:  None       Lives with his mother "she won't let me leave."      Employment:  [employer] x 21 years; repairman; happy      Tobacco: none      Alcohol:  none      Exercise:  Sporadic.  Some walking.    His Allergies Are:  No Known Allergies:   His Current Medications  Are:  Outpatient Encounter Prescriptions as of 08/14/2015  Medication Sig  . amLODipine (NORVASC) 10 MG tablet Take 1 tablet (10 mg total) by mouth daily.  Marland Kitchen lisinopril-hydrochlorothiazide (PRINZIDE,ZESTORETIC) 20-12.5 MG per tablet Take 1 tablet by mouth daily.  . rosuvastatin (CRESTOR) 20 MG tablet Take 1 tablet (20 mg total) by mouth daily.   No facility-administered encounter medications on file as of 08/14/2015.  :  Review of Systems:  Out of a complete 14 point review of systems, all are reviewed and negative with the exception of these symptoms as listed below:   Review of Systems  Neurological:       No new concerns per patient.     Objective:  Neurologic Exam  Physical Exam Physical Examination:   Filed Vitals:   08/14/15 1108  BP: 139/91  Pulse: 64  Resp: 18    General Examination: The patient is a very pleasant 55 y.o. male in no acute distress. He appears well-developed and well-nourished and well groomed.   HEENT: Normocephalic, atraumatic, pupils are equal, round and reactive to light and accommodation. Extraocular tracking is good without limitation to gaze excursion or nystagmus noted. Normal smooth pursuit is noted. Hearing is grossly intact. Face is symmetric with normal facial animation and normal facial sensation. Speech is clear with no dysarthria noted. There is no hypophonia. There is no lip, neck/head, jaw or  voice tremor. Neck is supple with full range of passive and active motion. There are no carotid bruits on auscultation. Oropharynx exam reveals: mild mouth dryness, adequate dental hygiene and marked airway crowding, due to large tongue, and large uvula, larger tonsils. Mallampati is class II. Tongue protrudes centrally and palate elevates symmetrically.   Chest: Clear to auscultation without wheezing, rhonchi or crackles noted.  Heart: S1+S2+0, regular and normal without murmurs, rubs or gallops noted.   Abdomen: Soft, non-tender and non-distended with normal bowel sounds appreciated on auscultation.  Extremities: There is no pitting edema in the distal lower extremities bilaterally. Pedal pulses are intact.  Skin: Warm and dry without trophic changes noted. There are no varicose veins.  Musculoskeletal: exam reveals no obvious joint deformities, tenderness or joint swelling or erythema.   Neurologically:  Mental status: The patient is awake, alert and oriented in all 4 spheres. His memory, attention, language and knowledge are appropriate. There is no aphasia, agnosia, apraxia or anomia. Speech is clear with normal prosody and enunciation. Thought process is linear. Mood is congruent and affect is normal.  Cranial nerves are as described above under HEENT exam. In addition, shoulder shrug is normal with equal shoulder height noted. Motor exam: Normal bulk, strength and tone is noted. There is no drift, tremor or rebound. Romberg is negative. Reflexes are 2+ throughout. Fine motor skills are intact with normal finger taps, normal hand movements, normal rapid alternating patting, normal foot taps and normal foot agility.  Cerebellar testing shows no dysmetria or intention tremor on finger to nose testing. Heel to shin is unremarkable bilaterally. There is no truncal or gait ataxia.  Sensory exam is intact to light touch in the upper and lower extremities.  Gait, station and balance are  unremarkable. No veering to one side is noted. No leaning to one side is noted. Posture is age-appropriate and stance is narrow based. No problems turning are noted. He turns en bloc. Tandem walk is unremarkable.          Assessment and Plan:   In summary, Jeromey Kruer is a very pleasant 69-year  old male with an underlying medical history of hyperlipidemia, bradycardia, obesity and hypertension, who presents for followup consultation of his complex sleep apnea with a primary central apnea component. He has been established on BiPAP ST treatment at a pressure of 15/10 with a rate of 12/min. His physical exam continues to be stable and he has had good results with the use of BiPAP ST, and good tolerance of the pressure and mask. I reviewed his home sleep study results in the past with him from 08/04/2013 and we talked about the BiPAP titration sleep study results as well again today. I also reviewed the most recent compliance data with the patient and recommended him for being fully compliant. His AHI has improved from 6.5 per hour to 5.4 per hour and now borderline at 5.6. His leak is acceptable. I renewed his PAP supplies today. I asked him to make sure that he does not skip any nights. We talked about maintaining a healthy lifestyle in general. I encouraged the patient to eat healthy, exercise daily and keep well hydrated, to keep a scheduled bedtime and wake time routine, to not skip any meals and eat healthy snacks in between meals and to have protein with every meal. I stressed the importance of regular exercise and of using his BiPAP machine every time he sleeps. His exam is stable and he is doing well. I would like to see him back in one year, sooner if needed. I answered all his questions today and he was in agreement. I spent 20 minutes in total face-to-face time with the patient, more than 50% of which was spent in counseling and coordination of care, reviewing test results, reviewing medication and  discussing or reviewing the diagnosis of CSA, OSA, the prognosis and treatment options.

## 2015-08-24 ENCOUNTER — Other Ambulatory Visit: Payer: Self-pay | Admitting: Family Medicine

## 2015-08-28 ENCOUNTER — Other Ambulatory Visit: Payer: Self-pay | Admitting: Family Medicine

## 2015-12-14 ENCOUNTER — Other Ambulatory Visit: Payer: Self-pay | Admitting: Physician Assistant

## 2016-04-18 ENCOUNTER — Ambulatory Visit (INDEPENDENT_AMBULATORY_CARE_PROVIDER_SITE_OTHER): Payer: Managed Care, Other (non HMO) | Admitting: Family Medicine

## 2016-04-18 VITALS — BP 138/92 | HR 86 | Temp 98.4°F | Resp 17 | Ht 70.0 in | Wt 224.0 lb

## 2016-04-18 DIAGNOSIS — E782 Mixed hyperlipidemia: Secondary | ICD-10-CM | POA: Diagnosis not present

## 2016-04-18 DIAGNOSIS — I10 Essential (primary) hypertension: Secondary | ICD-10-CM | POA: Diagnosis not present

## 2016-04-18 DIAGNOSIS — Z23 Encounter for immunization: Secondary | ICD-10-CM

## 2016-04-18 MED ORDER — ROSUVASTATIN CALCIUM 20 MG PO TABS: 20.0000 mg | ORAL_TABLET | Freq: Every day | ORAL | 3 refills | Status: DC

## 2016-04-18 MED ORDER — LISINOPRIL-HYDROCHLOROTHIAZIDE 20-12.5 MG PO TABS: 1.0000 | ORAL_TABLET | Freq: Every day | ORAL | 3 refills | Status: DC

## 2016-04-18 MED ORDER — AMLODIPINE BESYLATE 10 MG PO TABS: 10.0000 mg | ORAL_TABLET | Freq: Every day | ORAL | 3 refills | Status: DC

## 2016-04-18 NOTE — Progress Notes (Signed)
This Is a 55 year old man comes in requesting a flu shot and refills on his medications. He's currently taking Crestor, amlodipine, and lisinopril hydrochlorothiazide.  This gentleman is married and works for [employer].  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  Patient has had hyperlipidemia and hypertension for a number of years. He's having no muscle aches or fatigue. He's never had any problem with liver function. He denies cough or swelling in his ankles.  We discussed the importance of regular exercise and movement.  Patient requests flu shot today.  Objective:BP (!) 138/92 (BP Location: Right Arm, Patient Position: Sitting, Cuff Size: Normal)   Pulse 86   Temp 98.4 F (36.9 C) (Oral)   Resp 17   Ht 5\' 10"  (1.778 m)   Wt 224 lb (101.6 kg)   SpO2 97%   BMI 32.14 kg/m  I recheck the blood pressure and his numbers were 140/88. HEENT: Unremarkable Neck: Supple no adenopathy or thyromegaly Chest: Clear Heart: Soft 1 to 2/6 systolic murmur best heard at the left precordium. No gallop. Extremities: No edema    assessment:  Very nice gentleman who is very well educated and understands the importance of controlling blood pressure and cholesterol.  :Need for influenza vaccination - Plan: Flu Vaccine QUAD 36+ mos IM  Mixed hyperlipidemia - Plan: rosuvastatin (CRESTOR) 20 MG tablet, COMPLETE METABOLIC PANEL WITH GFR, Lipid panel  Essential hypertension - Plan: lisinopril-hydrochlorothiazide (PRINZIDE,ZESTORETIC) 20-12.5 MG tablet, amLODipine (NORVASC) 10 MG tablet, COMPLETE METABOLIC PANEL WITH GFR  Follow-up 6 months  Signed, Elvina SidleKurt Juston Goheen M.D.

## 2016-04-18 NOTE — Patient Instructions (Addendum)
IF you received an x-ray today, you will receive an invoice from [facility]. Please contact [facility] at [phone] with questions or concerns regarding your invoice.   IF you received labwork today, you will receive an invoice from [employer] [facility]. Please contact [facility] at [phone] with questions or concerns regarding your invoice.   Our billing staff will not be able to assist you with questions regarding bills from these companies.  You will be contacted with the lab results as soon as they are available. The fastest way to get your results is to activate your My Chart account. Instructions are located on the last page of this paperwork. If you have not heard from [place] regarding the results in 2 weeks, please contact this office.     \ Cholesterol Cholesterol is a white, waxy, fat-like substance needed by your body in small amounts. The liver makes all the cholesterol you need. Cholesterol is carried from the liver by the blood through the blood vessels. Deposits of cholesterol (plaque) may build up on blood vessel walls. These make the arteries narrower and stiffer. Cholesterol plaques increase the risk for heart attack and stroke.  You cannot feel your cholesterol level even if it is very high. The only way to know it is high is with a blood test. Once you know your cholesterol levels, you should keep a record of the test results. Work with your health care provider to keep your levels in the desired range.  WHAT DO THE RESULTS MEAN?  Total cholesterol is a rough measure of all the cholesterol in your blood.   LDL is the so-called bad cholesterol. This is the type that deposits cholesterol in the walls of the arteries. You want this level to be low.   HDL is the good cholesterol because it cleans the arteries and carries the LDL away. You want this level to be high.  Triglycerides are fat that the body can either burn for energy or  store. High levels are closely linked to heart disease.  WHAT ARE THE DESIRED LEVELS OF CHOLESTEROL?  Total cholesterol below 200.   LDL below 100 for people at risk, below 70 for those at very high risk.   HDL above 50 is good, above 60 is best.   Triglycerides below 150.  HOW CAN I LOWER MY CHOLESTEROL?  Diet. Follow your diet programs as directed by your health care provider.   Choose fish or white meat chicken and [place], roasted or baked. Limit fatty cuts of red meat, fried foods, and processed meats, such as sausage and lunch meats.   Eat lots of fresh fruits and vegetables.  Choose whole grains, beans, pasta, potatoes, and cereals.   Use only small amounts of olive, corn, or canola oils.   Avoid butter, mayonnaise, shortening, or palm kernel oils.  Avoid foods with trans fats.   Drink skim or nonfat milk and eat low-fat or nonfat yogurt and cheeses. Avoid whole milk, cream, ice cream, egg yolks, and full-fat cheeses.   Healthy desserts include angel food cake, ginger snaps, animal crackers, hard candy, popsicles, and low-fat or nonfat frozen yogurt. Avoid pastries, cakes, pies, and cookies.   Exercise. Follow your exercise programs as directed by your health care provider.   A regular program helps decrease LDL and raise HDL.   A regular program helps with weight control.   Do things that increase your activity level like gardening, walking, or taking the stairs. Ask your  health care provider about how you can be more active in your daily life.   Medicine. Take medicine only as directed by your health care provider.   Medicine may be prescribed by your health care provider to help lower cholesterol and decrease the risk for heart disease.   If you have several risk factors, you may need medicine even if your levels are normal.   This information is not intended to replace advice given to you by your health care provider. Make sure you discuss any  questions you have with your health care provider.   Document Released: 03/05/2001 Document Revised: 07/01/2014 Document Reviewed: 03/24/2013 Elsevier Interactive Patient Education 2016 [employer].  Hypertension Hypertension, commonly called high blood pressure, is when the force of blood pumping through your arteries is too strong. Your arteries are the blood vessels that carry blood from your heart throughout your body. A blood pressure reading consists of a higher number over a lower number, such as 110/72. The higher number (systolic) is the pressure inside your arteries when your heart pumps. The lower number (diastolic) is the pressure inside your arteries when your heart relaxes. Ideally you want your blood pressure below 120/80. Hypertension forces your heart to work harder to pump blood. Your arteries may become narrow or stiff. Having untreated or uncontrolled hypertension can cause heart attack, stroke, kidney disease, and other problems. RISK FACTORS Some risk factors for high blood pressure are controllable. Others are not.  Risk factors you cannot control include:   Race. You may be at higher risk if you are African American.  Age. Risk increases with age.  Gender. Men are at higher risk than women before age 85 years. After age 11, women are at higher risk than men. Risk factors you can control include:  Not getting enough exercise or physical activity.  Being overweight.  Getting too much fat, sugar, calories, or salt in your diet.  Drinking too much alcohol. SIGNS AND SYMPTOMS Hypertension does not usually cause signs or symptoms. Extremely high blood pressure (hypertensive crisis) may cause headache, anxiety, shortness of breath, and nosebleed. DIAGNOSIS To check if you have hypertension, your health care provider will measure your blood pressure while you are seated, with your arm held at the level of your heart. It should be measured at least twice using the same  arm. Certain conditions can cause a difference in blood pressure between your right and left arms. A blood pressure reading that is higher than normal on one occasion does not mean that you need treatment. If it is not clear whether you have high blood pressure, you may be asked to return on a different day to have your blood pressure checked again. Or, you may be asked to monitor your blood pressure at home for 1 or more weeks. TREATMENT Treating high blood pressure includes making lifestyle changes and possibly taking medicine. Living a healthy lifestyle can help lower high blood pressure. You may need to change some of your habits. Lifestyle changes may include:  Following the DASH diet. This diet is high in fruits, vegetables, and whole grains. It is low in salt, red meat, and added sugars.  Keep your sodium intake below 2,300 mg per day.  Getting at least 30-45 minutes of aerobic exercise at least 4 times per week.  Losing weight if necessary.  Not smoking.  Limiting alcoholic beverages.  Learning ways to reduce stress. Your health care provider may prescribe medicine if lifestyle changes are not enough to  get your blood pressure under control, and if one of the following is true:  You are 3218-53 years of age and your systolic blood pressure is above 140.  You are 53 years of age or older, and your systolic blood pressure is above 150.  Your diastolic blood pressure is above 90.  You have diabetes, and your systolic blood pressure is over 140 or your diastolic blood pressure is over 90.  You have kidney disease and your blood pressure is above 140/90.  You have heart disease and your blood pressure is above 140/90. Your personal target blood pressure may vary depending on your medical conditions, your age, and other factors. HOME CARE INSTRUCTIONS  Have your blood pressure rechecked as directed by your health care provider.   Take medicines only as directed by your health  care provider. Follow the directions carefully. Blood pressure medicines must be taken as prescribed. The medicine does not work as well when you skip doses. Skipping doses also puts you at risk for problems.  Do not smoke.   Monitor your blood pressure at home as directed by your health care provider. SEEK MEDICAL CARE IF:   You think you are having a reaction to medicines taken.  You have recurrent headaches or feel dizzy.  You have swelling in your ankles.  You have trouble with your vision. SEEK IMMEDIATE MEDICAL CARE IF:  You develop a severe headache or confusion.  You have unusual weakness, numbness, or feel faint.  You have severe chest or abdominal pain.  You vomit repeatedly.  You have trouble breathing. MAKE SURE YOU:   Understand these instructions.  Will watch your condition.  Will get help right away if you are not doing well or get worse.   This information is not intended to replace advice given to you by your health care provider. Make sure you discuss any questions you have with your health care provider.   Document Released: 06/10/2005 Document Revised: 10/25/2014 Document Reviewed: 04/02/2013 Elsevier Interactive Patient Education [employer]2016 Elsevier Inc.

## 2016-04-19 LAB — COMPLETE METABOLIC PANEL WITH GFR
ALT: 22 U/L (ref 9–46)
AST: 18 U/L (ref 10–35)
Albumin: 4.5 g/dL (ref 3.6–5.1)
Alkaline Phosphatase: 63 U/L (ref 40–115)
BUN: 11 mg/dL (ref 7–25)
CO2: 25 mmol/L (ref 20–31)
Calcium: 9.5 mg/dL (ref 8.6–10.3)
Chloride: 103 mmol/L (ref 98–110)
Creat: 1.08 mg/dL (ref 0.70–1.33)
GFR, Est African American: >89 mL/min (ref >=60)
GFR, Est Non African American: 78 mL/min (ref >=60)
Glucose, Bld: 126 mg/dL — ABNORMAL HIGH (ref 65–99)
Potassium: 3.5 mmol/L (ref 3.5–5.3)
Sodium: 141 mmol/L (ref 135–146)
Total Bilirubin: 0.6 mg/dL (ref 0.2–1.2)
Total Protein: 7.5 g/dL (ref 6.1–8.1)

## 2016-04-19 LAB — LIPID PANEL
Cholesterol: 150 mg/dL (ref 125–200)
HDL: 35 mg/dL — ABNORMAL LOW (ref >=40)
LDL Cholesterol: 63 mg/dL (ref <130)
Total CHOL/HDL Ratio: 4.3 Ratio (ref <=5.0)
Triglycerides: 259 mg/dL — ABNORMAL HIGH (ref <150)
VLDL: 52 mg/dL — ABNORMAL HIGH (ref <30)

## 2016-06-15 ENCOUNTER — Ambulatory Visit (INDEPENDENT_AMBULATORY_CARE_PROVIDER_SITE_OTHER): Payer: Managed Care, Other (non HMO) | Admitting: Family Medicine

## 2016-06-15 VITALS — BP 132/72 | HR 81 | Temp 98.7°F | Ht 70.0 in | Wt 221.4 lb

## 2016-06-15 DIAGNOSIS — R05 Cough: Secondary | ICD-10-CM

## 2016-06-15 DIAGNOSIS — J069 Acute upper respiratory infection, unspecified: Secondary | ICD-10-CM | POA: Diagnosis not present

## 2016-06-15 DIAGNOSIS — R059 Cough, unspecified: Secondary | ICD-10-CM

## 2016-06-15 MED ORDER — MUCINEX DM 30-600 MG PO TB12: 1.0000 | ORAL_TABLET | Freq: Two times a day (BID) | ORAL | 0 refills | Status: DC

## 2016-06-15 MED ORDER — BENZONATATE 100 MG PO CAPS: 100.0000 mg | ORAL_CAPSULE | Freq: Three times a day (TID) | ORAL | 0 refills | Status: DC | PRN

## 2016-06-15 NOTE — Patient Instructions (Addendum)
   IF you received an x-ray today, you will receive an invoice from [facility]. Please contact [facility] at [phone] with questions or concerns regarding your invoice.   IF you received labwork today, you will receive an invoice from [facility]. Please contact [facility] at [phone] with questions or concerns regarding your invoice.   Our billing staff will not be able to assist you with questions regarding bills from these companies.  You will be contacted with the lab results as soon as they are available. The fastest way to get your results is to activate your My Chart account. Instructions are located on the last page of this paperwork. If you have not heard from us regarding the results in 2 weeks, please contact this office.      Upper Respiratory Infection, Adult Most upper respiratory infections (URIs) are a viral infection of the air passages leading to the lungs. A URI affects the nose, throat, and upper air passages. The most common type of URI is nasopharyngitis and is typically referred to as "the common cold." URIs run their course and usually go away on their own. Most of the time, a URI does not require medical attention, but sometimes a bacterial infection in the upper airways can follow a viral infection. This is called a secondary infection. Sinus and middle ear infections are common types of secondary upper respiratory infections. Bacterial pneumonia can also complicate a URI. A URI can worsen asthma and chronic obstructive pulmonary disease (COPD). Sometimes, these complications can require emergency medical care and may be life threatening. What are the causes? Almost all URIs are caused by viruses. A virus is a type of germ and can spread from one person to another. What increases the risk? You may be at risk for a URI if:  You smoke.  You have chronic heart or lung disease.  You have a weakened defense (immune) system.  You are very  young or very old.  You have nasal allergies or asthma.  You work in crowded or poorly ventilated areas.  You work in health care facilities or schools. What are the signs or symptoms? Symptoms typically develop 2-3 days after you come in contact with a cold virus. Most viral URIs last 7-10 days. However, viral URIs from the influenza virus (flu virus) can last 14-18 days and are typically more severe. Symptoms may include:  Runny or stuffy (congested) nose.  Sneezing.  Cough.  Sore throat.  Headache.  Fatigue.  Fever.  Loss of appetite.  Pain in your forehead, behind your eyes, and over your cheekbones (sinus pain).  Muscle aches. How is this diagnosed? Your health care provider may diagnose a URI by:  Physical exam.  Tests to check that your symptoms are not due to another condition such as:  Strep throat.  Sinusitis.  Pneumonia.  Asthma. How is this treated? A URI goes away on its own with time. It cannot be cured with medicines, but medicines may be prescribed or recommended to relieve symptoms. Medicines may help:  Reduce your fever.  Reduce your cough.  Relieve nasal congestion. Follow these instructions at home:  Take medicines only as directed by your health care provider.  Gargle warm saltwater or take cough drops to comfort your throat as directed by your health care provider.  Use a warm mist humidifier or inhale steam from a shower to increase air moisture. This may make it easier to breathe.  Drink enough fluid to keep your urine clear   or pale yellow.  Eat soups and other clear broths and maintain good nutrition.  Rest as needed.  Return to work when your temperature has returned to normal or as your health care provider advises. You may need to stay home longer to avoid infecting others. You can also use a face mask and careful hand washing to prevent spread of the virus.  Increase the usage of your inhaler if you have asthma.  Do not  use any tobacco products, including cigarettes, chewing tobacco, or electronic cigarettes. If you need help quitting, ask your health care provider. How is this prevented? The best way to protect yourself from getting a cold is to practice good hygiene.  Avoid oral or hand contact with people with cold symptoms.  Wash your hands often if contact occurs. There is no clear evidence that vitamin C, vitamin E, echinacea, or exercise reduces the chance of developing a cold. However, it is always recommended to get plenty of rest, exercise, and practice good nutrition. Contact a health care provider if:  You are getting worse rather than better.  Your symptoms are not controlled by medicine.  You have chills.  You have worsening shortness of breath.  You have brown or red mucus.  You have yellow or brown nasal discharge.  You have pain in your face, especially when you bend forward.  You have a fever.  You have swollen neck glands.  You have pain while swallowing.  You have white areas in the back of your throat. Get help right away if:  You have severe or persistent:  Headache.  Ear pain.  Sinus pain.  Chest pain.  You have chronic lung disease and any of the following:  Wheezing.  Prolonged cough.  Coughing up blood.  A change in your usual mucus.  You have a stiff neck.  You have changes in your:  Vision.  Hearing.  Thinking.  Mood. This information is not intended to replace advice given to you by your health care provider. Make sure you discuss any questions you have with your health care provider. Document Released: 12/04/2000 Document Revised: 02/11/2016 Document Reviewed: 09/15/2013 Elsevier Interactive Patient Education  2017 [employer]vier Inc.  

## 2016-06-15 NOTE — Progress Notes (Signed)
Subjective:    Patient ID: Seth Carson, male    DOB: 22-Aug-1960, 55 y.o.   MRN: [id number]  06/15/2016  Cough (X 1 1/2 weeks)   HPI This 55 y.o. male presents for evaluation of cough for ten days.  Onset two weeks ago.  Improved temporarily and then returned.  Also developed n/v/d this week.  Sister is babysitting at house with a 7seven month old, three year old, 7eighteen month old.  No fever/chills/sweats.  No headache.  No ear pain.  Mild sore throat intermittently.  Itchy scratchy throat.  Felt good yesterday but then coughed again; +sputum production.  No nasal congestion; no rhinorrhea; +coughing; no SOB.  No wheezing.  Tried equate robitussin, Chlorasedin.    Father died with COPD and recurrent pneumonia.     Review of Systems  Constitutional: Negative for activity change, appetite change, chills, diaphoresis, fatigue and fever.  HENT: Positive for congestion, postnasal drip, rhinorrhea, sore throat and voice change. Negative for ear pain and trouble swallowing.   Respiratory: Positive for cough. Negative for shortness of breath and wheezing.   Cardiovascular: Negative for chest pain, palpitations and leg swelling.  Gastrointestinal: Negative for abdominal pain, diarrhea, nausea and vomiting.  Endocrine: Negative for cold intolerance, heat intolerance, polydipsia, polyphagia and polyuria.  Skin: Negative for color change, rash and wound.  Neurological: Negative for dizziness, tremors, seizures, syncope, facial asymmetry, speech difficulty, weakness, light-headedness, numbness and headaches.  Psychiatric/Behavioral: Negative for dysphoric mood and sleep disturbance. The patient is not nervous/anxious.     Past Medical History:  Diagnosis Date  . Hyperlipidemia   . Hypertension    History reviewed. No pertinent surgical history. No Known Allergies  Social History   Social History  . Marital status: Single    Spouse name: n/a  . Number of children: 0  . Years of  education: 12   Occupational History  . Systems developercopier technician    Social History Main Topics  . Smoking status: Never Smoker  . Smokeless tobacco: Never Used  . Alcohol use No  . Drug use: No  . Sexual activity: Not on file   Other Topics Concern  . Not on file   Social History Narrative   Marital status: single;       Children:  None       Lives with his mother "she won't let me leave."      Employment:  [employer] x 21 years; repairman; happy      Tobacco: none      Alcohol:  none      Exercise:  Sporadic.  Some walking.   Family History  Problem Relation Age of Onset  . Emphysema Father   . Cancer Father [id number]    lung cancer with liver mets; prostate cancer  . Hypertension Father   . Hypertension Mother   . Heart disease Brother 1458    CABG  . Hypertension Brother   . Cancer Maternal Grandmother   . Hypertension Maternal Grandfather   . Stroke Maternal Grandfather   . Cancer Paternal Grandfather   . Hypertension Sister   . Sleep apnea Brother        Objective:    BP 132/72 (BP Location: Right Arm, Patient Position: Sitting, Cuff Size: Large)   Pulse 81   Temp 98.7 F (37.1 C) (Oral)   Ht 5\' 10"  (1.778 m)   Wt 221 lb 6.4 oz (100.4 kg)   SpO2 96%   BMI 31.77 kg/m  Physical Exam  Constitutional: He is oriented to person, place, and time. He appears well-developed and well-nourished. No distress.  HENT:  Head: Normocephalic and atraumatic.  Right Ear: Tympanic membrane, external ear and ear canal normal.  Left Ear: Tympanic membrane, external ear and ear canal normal.  Mouth/Throat: Uvula is midline and mucous membranes are normal. No oral lesions. No uvula swelling. Posterior oropharyngeal erythema present. No oropharyngeal exudate, posterior oropharyngeal edema or tonsillar abscesses.  Visible mucous in OP thick white yellow.  Eyes: Conjunctivae and EOM are normal. Pupils are equal, round, and reactive to light.  Neck: Normal range of motion. Neck supple.  Carotid bruit is not present. No thyromegaly present.  Cardiovascular: Normal rate, regular rhythm, normal heart sounds and intact distal pulses.  Exam reveals no gallop and no friction rub.   No murmur heard. Pulmonary/Chest: Effort normal and breath sounds normal. He has no wheezes. He has no rales.  Lymphadenopathy:    He has no cervical adenopathy.  Neurological: He is alert and oriented to person, place, and time. No cranial nerve deficit.  Skin: Skin is warm and dry. No rash noted. He is not diaphoretic.  Psychiatric: He has a normal mood and affect. His behavior is normal.  Nursing note and vitals reviewed.  Results for orders placed or performed in visit on 04/18/16  COMPLETE METABOLIC PANEL WITH GFR  Result Value Ref Range   Sodium 141 135 - 146 mmol/L   Potassium 3.5 3.5 - 5.3 mmol/L   Chloride 103 98 - 110 mmol/L   CO2 25 20 - 31 mmol/L   Glucose, Bld 126 (H) 65 - 99 mg/dL   BUN 11 7 - 25 mg/dL   Creat 4.091.08 8.110.70 - 9.141.33 mg/dL   Total Bilirubin 0.6 0.2 - 1.2 mg/dL   Alkaline Phosphatase 63 40 - 115 U/L   AST 18 10 - 35 U/L   ALT 22 9 - 46 U/L   Total Protein 7.5 6.1 - 8.1 g/dL   Albumin 4.5 3.6 - 5.1 g/dL   Calcium 9.5 8.6 - 78.210.3 mg/dL   GFR, Est African American >89 >=60 mL/min   GFR, Est Non African American 78 >=60 mL/min  Lipid panel  Result Value Ref Range   Cholesterol 150 125 - 200 mg/dL   Triglycerides 956259 (H) <150 mg/dL   HDL 35 (L) >=21>=40 mg/dL   Total CHOL/HDL Ratio 4.3 <=5.0 Ratio   VLDL 52 (H) <30 mg/dL   LDL Cholesterol 63 <308<130 mg/dL       Assessment & Plan:   1. Acute upper respiratory infection   2. Cough    -New with persistent cough for two weeks. -consistent with viral syndrome; normal vitals. -rx for Mucinex DM and Tessalon Perles provided. -if no improvement in 1-2 weeks, call office for treatment of secondary sinusitis.   No orders of the defined types were placed in this encounter.  Meds ordered this encounter  Medications  .  Dextromethorphan-Guaifenesin (MUCINEX DM) 30-600 MG TB12    Sig: Take 1 tablet by mouth 2 (two) times daily.    Dispense:  28 each    Refill:  0  . benzonatate (TESSALON) 100 MG capsule    Sig: Take 1-2 capsules (100-200 mg total) by mouth 3 (three) times daily as needed for cough.    Dispense:  45 capsule    Refill:  0    No Follow-up on file.   Kristi Paulita FujitaMartin Smith, M.D. [facility]  [place] [address]  [place], [place]  [address] [phone] phone [phone] fax

## 2016-06-22 ENCOUNTER — Telehealth: Payer: Self-pay

## 2016-06-22 NOTE — Telephone Encounter (Signed)
Pt called, not feeling better since last visit 12/23 with Dr Katrinka BlazingSmith. Noted in chart "if no improvement in 1-2 weeks, call office for treatment of secondary sinusitis". I asked him to wait for another provider to review and we will give him a call. Thanks!

## 2016-06-25 NOTE — Telephone Encounter (Signed)
Call patient --what symptoms is he currently having since last visit?  What is not better?

## 2016-06-26 NOTE — Telephone Encounter (Signed)
Dr. Katrinka BlazingSmith,   Seth Carson states he is feeling much better, the only problem that he is having is a cough that is keeping him up at night.

## 2016-06-28 MED ORDER — PROMETHAZINE-DM 6.25-15 MG/5ML PO SYRP: 5.0000 mL | ORAL_SOLUTION | Freq: Four times a day (QID) | ORAL | 0 refills | Status: DC | PRN

## 2016-06-28 NOTE — Telephone Encounter (Signed)
Noted.  What is he currently taking at bedtime for cough?  Is he still taking Mucinex and Tessalon Perles?

## 2016-06-28 NOTE — Addendum Note (Signed)
Addended by: Ethelda ChickSMITH, KRISTI M on: 06/28/2016 12:50 PM   Modules accepted: Orders

## 2016-06-28 NOTE — Telephone Encounter (Signed)
Spoke with patient, informing him of prescription and anticipated resolution of symptoms. Pt verbalized understanding.

## 2016-06-28 NOTE — Telephone Encounter (Signed)
I sent in Phenergan DM cough syrup for him to take at nighttime; he can take with with Tessalon Perles at bedtime.  I anticipate the cough should resolve at nighttime in the upcoming 1-2 weeks.  I am less concerned now that he is only coughing at nighttime.

## 2016-06-28 NOTE — Telephone Encounter (Signed)
Pt states he is still taking mucinex and tessalon perles. He is also taking robustussin.

## 2016-08-12 ENCOUNTER — Encounter: Payer: Self-pay | Admitting: Neurology

## 2016-08-12 ENCOUNTER — Ambulatory Visit (INDEPENDENT_AMBULATORY_CARE_PROVIDER_SITE_OTHER): Payer: Managed Care, Other (non HMO) | Admitting: Neurology

## 2016-08-12 VITALS — BP 146/90 | HR 76 | Resp 20 | Ht 69.0 in | Wt 218.0 lb

## 2016-08-12 DIAGNOSIS — G4733 Obstructive sleep apnea (adult) (pediatric): Secondary | ICD-10-CM

## 2016-08-12 DIAGNOSIS — G4731 Primary central sleep apnea: Secondary | ICD-10-CM

## 2016-08-12 NOTE — Patient Instructions (Signed)

## 2016-08-12 NOTE — Progress Notes (Signed)
Subjective:    Patient ID: Seth Carson is a 56 y.o. male.  HPI     Interim history:  Seth Carson is a very pleasant 56 year old right-handed gentleman with an underlying medical history of hyperlipidemia, bradycardia, obesity and hypertension, who presents for followup consultation of his complex sleep apnea. He is unaccompanied today and presents for his one-year checkup. I last saw him on 08/14/2015, at which time he reported doing well. He was grieving the loss of his grandmother about 2 months prior. He had been her guardian. She lived to be 54 years old. He was fully compliant with BiPAP ST. I suggested a one-year checkup.  Today, 08/12/2016 (all dictated new, as well as above notes, some dictation done in note pad or Word, outside of chart, may appear as copied):  I reviewed his BiPAP ST compliance data from 07/09/2016 through 08/07/2016, which is a total of 30 days, during which time he used his BiPAP every night with percent used days greater than 4 hours at 100%, indicating superb compliance with an average usage of 6 hours and 55 minutes, residual AHI 5.2 per hour, leak acceptable with the 95th percentile at 12.4 L/m on a pressure of 15/10 cm with a backup rate of 12/m. When compared to last years download around this time he has been using his BiPAP approximately an hour more than last year on average. He still reports some residual daytime tiredness and not feeling his great with his BiPAP as when he first started using it. He does realize that when he first got diagnosed with central sleep apnea or complex sleep apnea he was also sleep deprived and had a big benefit from using BiPAP initially. He's explained that sometimes this initial great feeling plateaus. In addition, he does admit to having more stress, including work-related stress and stress at home. His sister had to move in with him as she lost her job and she has a 54 year old who dropped out of college. They also host children  at the house as she babysits four kids, ages 74 year to 7 years. His weight has been fluctuating. He did not take his blood pressure medication over the weekend as he forgot. His blood pressure is a little bit higher than his typical. He admits that he does not always hydrate well enough with water.   The patient's allergies, current medications, family history, past medical history, past social history, past surgical history and problem list were reviewed and updated as appropriate.   Previously (copied from previous notes for reference):   I saw him on 08/12/2014, at which time he reported doing well. He was compliant with treatment. I suggested a one-year checkup.    I reviewed his BiPAP ST compliance data from 07/11/2015 through 08/09/2015 which is a total of 30 days during which time he was 100% compliant, average usage of 5 hours and 59 minutes, residual AHI slightly borderline at 5.6 per hour, leak acceptable with the 95th percentile at 12.1 L/m on a pressure of 15/10 with a rate of 12.    I saw him on 12/06/2013, at which time he was fully compliant with BiPAP therapy. He reported feeling much better, much less tired during the day and he had adjusted to the treatment well. He had some worsening air leak from the mask.    I reviewed his compliance data from 07/11/2014 through 08/09/2014 which is a total of 30 days during which time he used his machine every night except for 1  night with percent used days greater than 4 hours of 80%, indicating very good compliance with an average usage of 5 hours and 41 minutes and residual AHI borderline at 5.4 per hour and leak acceptable with the 95th percentile of 12 L/m at the current setting of 15/10 cm with a rate of 12/min.   I first met him on 07/09/2013, at which time he reported snoring, mouth dryness, daytime somnolence and witnessed apneas. I invited him back for sleep study but his insurance mandated that he have a home sleep test. He had a home  sleep test which showed primary central sleep apnea syndrome with 46% of the events being central in nature and 35% of the events were obstructive in nature for total AHI estimated at 49 per hour. Desaturation nadir was 65% during that home sleep study. He was invited back for a full night titration study with positive airway pressure. He had a Pap titration study on 09/14/2013 and I went over his home sleep test results as well as titration test results in detail today. His sleep efficiency during the study was 90.6% with a latency to sleep of 19 minutes and wake after sleep onset of 21 minutes with mild to moderate sleep fragmentation noted. His arousal index was mildly elevated. He had a normal percentage of stage I sleep, an increased percentage of stage II sleep, 3.5% of slow-wave sleep and an increased percentage of REM sleep at 28.1% with a reduced REM latency. He had no significant periodic leg movements and rare PVCs were noted. He was initiated on CPAP first utilizing a large nasal mask which he chose over the nasal pillows mask. Almost immediately after CPAP was started he exhibited central apneas. CPAP was gradually titrated from 5-8 cm with ongoing central respiratory events. He was therefore switched to BiPAP at a pressure of 10/6 cm and BiPAP was titrated to 11/7 cm with persistent central events and the patient was therefore switched to BiPAP ST and titrated to a final pressure of 16/12 cm with a rate of 12. Supine REM sleep was achieved. Residual AHI was 2.9 per hour. Oxygen saturation was 93% on average with a nadir of 80%. I prescribed BiPAP ST at a pressure of 15/10 with a rate of 12 for him. I reviewed the patient's BiPAP compliance data from 10/11/2013 to 11/09/2013, which is a total of 30 days, during which time the patient used PAP every day. The average usage for all days was 6 hours and 46 minutes. The percent used days greater than 4 hours was 100 %, indicating superb compliance. The  residual AHI was mildly suboptimal at 6.4 per hour, indicating a mildly suboptimal treatment pressure of 15/10 cwp with a backup rate of 12 breaths per minute. Air leak from the mask was low at 11.3 L per minute at the 95th percentile.  I reviewed his compliance data from 11/05/2013 through 12/04/2013 which is a total of 30 days during which time he used his BiPAP every night with 100% compliance, average usage of 6 hours and 36 minutes, residual AHI slightly suboptimal at 6.5 and leak acceptable at 20.   I reviewed his compliance data from 05/04/2014 through 06/02/2014 which is a total of 30 days during which time he used his machine 27 days with percent used days greater than 4 hours at 87%, indicating very good compliance with an average usage of 5 hours of 9 minutes. Residual AHI borderline elevated at 6 per hour and leak acceptable with  the 95th percentile at 10.3 L/m. He continues to be on BiPAP ST 15/10 cm with a rate of 12.   His Past Medical History Is Significant For: Past Medical History:  Diagnosis Date  . Hyperlipidemia   . Hypertension     His Past Surgical History Is Significant For: No past surgical history on file.  His Family History Is Significant For: Family History  Problem Relation Age of Onset  . Emphysema Father   . Cancer Father 54    lung cancer with liver mets; prostate cancer  . Hypertension Father   . Hypertension Mother   . Heart disease Brother 80    CABG  . Hypertension Brother   . Cancer Maternal Grandmother   . Hypertension Maternal Grandfather   . Stroke Maternal Grandfather   . Cancer Paternal Grandfather   . Hypertension Sister   . Sleep apnea Brother     His Social History Is Significant For: Social History   Social History  . Marital status: Single    Spouse name: n/a  . Number of children: 0  . Years of education: 12   Occupational History  . Scientist, research (physical sciences)    Social History Main Topics  . Smoking status: Never Smoker  .  Smokeless tobacco: Never Used  . Alcohol use No  . Drug use: No  . Sexual activity: Not Asked   Other Topics Concern  . None   Social History Narrative   Marital status: single;       Children:  None       Lives with his mother "she won't let me leave."      Employment:  [employer] x 21 years; repairman; happy      Tobacco: none      Alcohol:  none      Exercise:  Sporadic.  Some walking.    His Allergies Are:  No Known Allergies:   His Current Medications Are:  Outpatient Encounter Prescriptions as of 08/12/2016  Medication Sig  . amLODipine (NORVASC) 10 MG tablet Take 1 tablet (10 mg total) by mouth daily.  Marland Kitchen lisinopril-hydrochlorothiazide (PRINZIDE,ZESTORETIC) 20-12.5 MG tablet Take 1 tablet by mouth daily.  . rosuvastatin (CRESTOR) 20 MG tablet Take 1 tablet (20 mg total) by mouth daily.  . [DISCONTINUED] benzonatate (TESSALON) 100 MG capsule Take 1-2 capsules (100-200 mg total) by mouth 3 (three) times daily as needed for cough.  . [DISCONTINUED] Dextromethorphan-Guaifenesin (MUCINEX DM) 30-600 MG TB12 Take 1 tablet by mouth 2 (two) times daily.  . [DISCONTINUED] promethazine-dextromethorphan (PROMETHAZINE-DM) 6.25-15 MG/5ML syrup Take 5 mLs by mouth 4 (four) times daily as needed for cough.   No facility-administered encounter medications on file as of 08/12/2016.   :  Review of Systems:  Out of a complete 14 point review of systems, all are reviewed and negative with the exception of these symptoms as listed below:  Review of Systems  Neurological:       Pt presents today to discuss his cpap usage. Pt says that he does not feel as good as he did when he started cpap.    Objective:  Neurologic Exam  Physical Exam Physical Examination:   Vitals:   08/12/16 1112  BP: (!) 146/90  Pulse: 76  Resp: 20   General Examination: The patient is a very pleasant 56 y.o. male in no acute distress. He appears well-developed and well-nourished and well groomed.    HEENT: Normocephalic, atraumatic, pupils are equal, round and reactive to light and accommodation.  Extraocular tracking is good without limitation to gaze excursion or nystagmus noted. Normal smooth pursuit is noted. Hearing is grossly intact. Face is symmetric with normal facial animation and normal facial sensation. Speech is clear with no dysarthria noted. There is no hypophonia. There is no lip, neck/head, jaw or voice tremor. Neck is supple with full range of passive and active motion. There are no carotid bruits on auscultation. Oropharynx exam reveals: mild mouth dryness, adequate dental hygiene and marked airway crowding. Mallampati is class II. Tongue protrudes centrally and palate elevates symmetrically.   Chest: Clear to auscultation without wheezing, rhonchi or crackles noted.  Heart: S1+S2+0, regular and normal without murmurs, rubs or gallops noted.   Abdomen: Soft, non-tender and non-distended with normal bowel sounds appreciated on auscultation.  Extremities: There is no pitting edema in the distal lower extremities bilaterally. Pedal pulses are intact.  Skin: Warm and dry without trophic changes noted.  Musculoskeletal: exam reveals no obvious joint deformities, tenderness or joint swelling or erythema.   Neurologically:  Mental status: The patient is awake, alert and oriented in all 4 spheres. His immediate and remote memory, attention, language skills and fund of knowledge are appropriate. There is no evidence of aphasia, agnosia, apraxia or anomia. Speech is clear with normal prosody and enunciation. Thought process is linear. Mood is normal and affect is normal.  Cranial nerves II - XII are as described above under HEENT exam. In addition: shoulder shrug is normal with equal shoulder height noted. Motor exam: Normal bulk, strength and tone is noted. There is no drift, tremor or rebound. Romberg is negative. Reflexes are 2+. Fine motor skills and coordination: intact.   Cerebellar testing: No dysmetria or intention tremor on finger to nose testing.  Sensory exam: intact to light touch in the upper and lower extremities.  Gait, station and balance: He stands easily. No veering to one side is noted. No leaning to one side is noted. Posture is age-appropriate and stance is narrow based. Gait shows normal stride length and pace, no problems. Tandem walk is unremarkable.   Assessment and Plan:   In summary, Seth Carson is a very pleasant 56 y.o.-year old male withAn underlying medical history of bradycardia, obesity, hyperlipidemia, hypertension, who presents for follow-up consultation of his primary central apnea and complex sleep apnea, treated well with BiPAP ST with full compliance. He had initially very good results with BiPAP and since then this initial significant improvement in sleep quality and daytime energy have plateaued. He has had residual or additional recent stressors with work and stress at home. This may have an impact on his overall well-being. His blood pressure is a little borderline today but he has not taken his blood pressure medicine over the weekend. Overall, he is doing well. He admits that he has done much better with regards to his sleep when he first started treatment for sleep apnea in 2015. Physical exam is stable. I renewed his prescription for BiPAP related supplies. I suggested a one-year checkup, he can see one of our nurse practitioners at the time. He is commended for his full treatment adherence. I answered all his questions today and he was in agreement.I spent 20 minutes in total face-to-face time with the patient, more than 50% of which was spent in counseling and coordination of care, reviewing test results, reviewing medication and discussing or reviewing the diagnosis of CSA and OSA, the prognosis and treatment options. Pertinent laboratory and imaging test results that were available during this visit  with the patient were  reviewed by me and considered in my medical decision making (see chart for details).

## 2017-07-01 ENCOUNTER — Ambulatory Visit: Payer: Managed Care, Other (non HMO) | Admitting: Family Medicine

## 2017-07-01 ENCOUNTER — Other Ambulatory Visit: Payer: Self-pay

## 2017-07-01 ENCOUNTER — Encounter: Payer: Self-pay | Admitting: Family Medicine

## 2017-07-01 VITALS — BP 132/82 | HR 65 | Temp 98.6°F | Resp 17 | Ht 69.0 in | Wt 234.4 lb

## 2017-07-01 DIAGNOSIS — Z23 Encounter for immunization: Secondary | ICD-10-CM

## 2017-07-01 DIAGNOSIS — E782 Mixed hyperlipidemia: Secondary | ICD-10-CM

## 2017-07-01 DIAGNOSIS — I10 Essential (primary) hypertension: Secondary | ICD-10-CM

## 2017-07-01 DIAGNOSIS — R7303 Prediabetes: Secondary | ICD-10-CM

## 2017-07-01 DIAGNOSIS — Z1211 Encounter for screening for malignant neoplasm of colon: Secondary | ICD-10-CM

## 2017-07-01 DIAGNOSIS — E785 Hyperlipidemia, unspecified: Secondary | ICD-10-CM

## 2017-07-01 DIAGNOSIS — G4733 Obstructive sleep apnea (adult) (pediatric): Secondary | ICD-10-CM

## 2017-07-01 MED ORDER — ROSUVASTATIN CALCIUM 20 MG PO TABS: 20.0000 mg | ORAL_TABLET | Freq: Every day | ORAL | 1 refills | Status: DC

## 2017-07-01 MED ORDER — LISINOPRIL-HYDROCHLOROTHIAZIDE 20-12.5 MG PO TABS: 1.0000 | ORAL_TABLET | Freq: Every day | ORAL | 1 refills | Status: DC

## 2017-07-01 MED ORDER — AMLODIPINE BESYLATE 10 MG PO TABS: 10.0000 mg | ORAL_TABLET | Freq: Every day | ORAL | 1 refills | Status: DC

## 2017-07-01 NOTE — Patient Instructions (Addendum)
  Blood pressure looks overall ok, even with being off meds for the most part recently.  You can try just restarting the amlodipine initially - once per day (sent to your pharmacy). If blood pressure runs over 140/90, then restart lisinopril hydrochlorothiazide as well - I will print that prescription if needed.   Restart cholesterol med now, then return in 2 months for repeat testing ON medications.   I will refer you for colonoscopy.    IF you received an x-ray today, you will receive an invoice from [facility]. Please contact [facility] at [phone] with questions or concerns regarding your invoice.   IF you received labwork today, you will receive an invoice from [facility]. Please contact [facility] at [phone] with questions or concerns regarding your invoice.   Our billing staff will not be able to assist you with questions regarding bills from these companies.  You will be contacted with the lab results as soon as they are available. The fastest way to get your results is to activate your My Chart account. Instructions are located on the last page of this paperwork. If you have not heard from us regarding the results in 2 weeks, please contact this office.

## 2017-07-01 NOTE — Progress Notes (Signed)
Subjective:  By signing my name below, I, Seth Carson, attest that this documentation has been prepared under the direction and in the presence of [name], MD Electronically Signed: Charline Bills, ED Scribe 07/01/2017 at 4:45 PM.   Patient ID: Seth Carson, male    DOB: October 17, 1960, 57 y.o.   MRN: [id number]  Chief Complaint  Patient presents with  . BP follow up  . Medication Refill    norvasc,lisinopril-hctz, rosuvastatin    HPI Seth Carson is a 57 y.o. male who presents to Primary Care at [facility] for f/u.   HTN Amlodipine 10 mg qd, Lisinopril-HCTZ 20-12.5 mg. Pt ran out of his meds ~1 wk ago. States he was getting HAs a few days ago so he took some of his mother's HCTZ tabs. He has not been checking his BP outside of the office. Denies cp, sob, light-headedness, dizziness, any side-effects while on meds. Lab Results  Component Value Date   CREATININE 1.08 04/18/2016   Hyperlipidemia Lab Results  Component Value Date   CHOL 150 04/18/2016   HDL 35 (L) 04/18/2016   LDLCALC 63 04/18/2016   TRIG 259 (H) 04/18/2016   CHOLHDL 4.3 04/18/2016   Lab Results  Component Value Date   ALT 22 04/18/2016   AST 18 04/18/2016   ALKPHOS 63 04/18/2016   BILITOT 0.6 04/18/2016  Crestor 20 mg qd. Pt ran out of meds ~1 wk ago. Pt is not fasting at this visit; last ate around noon today.  OSA on BiPAP Pt is still using BiPAP machine nightly.  Pre-Diabetes Pt has not checked his blood glucose outside of the office.  Lab Results  Component Value Date   HGBA1C 5.9 02/06/2015   Wt Readings from Last 3 Encounters:  07/01/17 234 lb 6.4 oz (106.3 kg)  08/12/16 218 lb (98.9 kg)  06/15/16 221 lb 6.4 oz (100.4 kg)   Preventive Maintenance Pt has not had a colonoscopy yet; states he would like to schedule further out than 1 wk so he can plan around his work schedule.  Patient Active Problem List   Diagnosis Date Noted  . Unspecified sleep apnea 08/17/2013  . HTN  (hypertension) 06/10/2013  . Hyperlipidemia 06/10/2013   Past Medical History:  Diagnosis Date  . Hyperlipidemia   . Hypertension    No past surgical history on file. No Known Allergies Prior to Admission medications   Medication Sig Start Date End Date Taking? Authorizing Provider  amLODipine (NORVASC) 10 MG tablet Take 1 tablet (10 mg total) by mouth daily. 04/18/16  Yes Elvina Sidle, MD  lisinopril-hydrochlorothiazide (PRINZIDE,ZESTORETIC) 20-12.5 MG tablet Take 1 tablet by mouth daily. 04/18/16  Yes Elvina Sidle, MD  rosuvastatin (CRESTOR) 20 MG tablet Take 1 tablet (20 mg total) by mouth daily. 04/18/16  Yes Elvina Sidle, MD   Social History   Socioeconomic History  . Marital status: Single    Spouse name: n/a  . Number of children: 0  . Years of education: 26  . Highest education level: Not on file  Social Needs  . Financial resource strain: Not on file  . Food insecurity - worry: Not on file  . Food insecurity - inability: Not on file  . Transportation needs - medical: Not on file  . Transportation needs - non-medical: Not on file  Occupational History  . Occupation: Systems developer  Tobacco Use  . Smoking status: Never Smoker  . Smokeless tobacco: Never Used  Substance and Sexual Activity  . Alcohol use: No  .  Drug use: No  . Sexual activity: Not on file  Other Topics Concern  . Not on file  Social History Narrative   Marital status: single;       Children:  None       Lives with his mother "she won't let me leave."      Employment:  [employer] x 21 years; repairman; happy      Tobacco: none      Alcohol:  none      Exercise:  Sporadic.  Some walking.   Review of Systems  Constitutional: Negative for fatigue and unexpected weight change.  Eyes: Negative for visual disturbance.  Respiratory: Negative for cough, chest tightness and shortness of breath.   Cardiovascular: Negative for chest pain, palpitations and leg swelling.    Gastrointestinal: Negative for abdominal pain and blood in stool.  Neurological: Positive for headaches (resolved). Negative for dizziness and light-headedness.      Objective:   Physical Exam  Constitutional: He is oriented to person, place, and time. He appears well-developed and well-nourished.  HENT:  Head: Normocephalic and atraumatic.  Eyes: EOM are normal. Pupils are equal, round, and reactive to light.  Neck: No JVD present. Carotid bruit is not present.  Cardiovascular: Normal rate, regular rhythm and normal heart sounds.  No murmur heard. Pulmonary/Chest: Effort normal and breath sounds normal. He has no rales.  Abdominal: There is no tenderness.  Musculoskeletal: He exhibits no edema.  Neurological: He is alert and oriented to person, place, and time.  Skin: Skin is warm and dry.  Psychiatric: He has a normal mood and affect.  Vitals reviewed.  Vitals:   07/01/17 1556  BP: 132/82  Pulse: 65  Resp: 17  Temp: 98.6 F (37 C)  TempSrc: Oral  SpO2: 97%  Weight: 234 lb 6.4 oz (106.3 kg)  Height: 5\' 9"  (1.753 m)      Assessment & Plan:   Seth Densellan Firebaugh is a [age] y.o. male Essential hypertension - Plan: Basic metabolic panel, amLODipine (NORVASC) 10 MG tablet, lisinopril-hydrochlorothiazide (PRINZIDE,ZESTORETIC) 20-12.5 MG tablet  - based on current readings - will restart only amlodipine initially.  If elevated readings, can fill printed Rx for lisinopril/HCT. Labs pending.   Hyperlipidemia, unspecified hyperlipidemia type Mixed hyperlipidemia - Plan: rosuvastatin (CRESTOR) 20 MG tablet  - check labs - may not be as reliable as off meds past week and not completely fasting. consider repeat after 8 hrs if elevated. Restart Crestor, then recheck labs in 6-8 weeks.   Prediabetes - Plan: Hemoglobin A1c  - check A1c.   OSA treated with BiPAP  - continue Bipap.   Flu vaccine need - Plan: Flu Vaccine QUAD 36+ mos IM  Screen for colon cancer - Plan: Ambulatory referral  to Gastroenterology   Meds ordered this encounter  Medications  . amLODipine (NORVASC) 10 MG tablet    Sig: Take 1 tablet (10 mg total) by mouth daily.    Dispense:  90 tablet    Refill:  1  . lisinopril-hydrochlorothiazide (PRINZIDE,ZESTORETIC) 20-12.5 MG tablet    Sig: Take 1 tablet by mouth daily.    Dispense:  90 tablet    Refill:  1  . rosuvastatin (CRESTOR) 20 MG tablet    Sig: Take 1 tablet (20 mg total) by mouth daily.    Dispense:  90 tablet    Refill:  1   Patient Instructions    Blood pressure looks overall ok, even with being off meds for the most part recently.  You can try just restarting the amlodipine initially - once per day (sent to your pharmacy). If blood pressure runs over 140/90, then restart lisinopril hydrochlorothiazide as well - I will print that prescription if needed.   Restart cholesterol med now, then return in 2 months for repeat testing ON medications.   I will refer you for colonoscopy.    IF you received an x-ray today, you will receive an invoice from [facility]. Please contact [facility] at [phone] with questions or concerns regarding your invoice.   IF you received labwork today, you will receive an invoice from [facility]. Please contact [facility] at [phone] with questions or concerns regarding your invoice.   Our billing staff will not be able to assist you with questions regarding bills from these companies.  You will be contacted with the lab results as soon as they are available. The fastest way to get your results is to activate your My Chart account. Instructions are located on the last page of this paperwork. If you have not heard from [place] regarding the results in 2 weeks, please contact this office.      I personally performed the services described in this documentation, which was scribed in my presence. The recorded information has been reviewed and considered for accuracy and completeness, addended by me  as needed, and agree with information above.  Signed,   Meredith Staggers, MD Primary Care at [facility].  07/05/17 10:29 PM

## 2017-07-02 LAB — BASIC METABOLIC PANEL
BUN/Creatinine Ratio: 11 (ref 9–20)
BUN: 12 mg/dL (ref 6–24)
CO2: 25 mmol/L (ref 20–29)
CREATININE: 1.05 mg/dL (ref 0.76–1.27)
Calcium: 9.7 mg/dL (ref 8.7–10.2)
Chloride: 105 mmol/L (ref 96–106)
GFR calc Af Amer: 93 mL/min/{1.73_m2} (ref >59)
GFR calc non Af Amer: 80 mL/min/{1.73_m2} (ref >59)
GLUCOSE: 97 mg/dL (ref 65–99)
Potassium: 3.9 mmol/L (ref 3.5–5.2)
Sodium: 144 mmol/L (ref 134–144)

## 2017-07-02 LAB — HEMOGLOBIN A1C
Est. average glucose Bld gHb Est-mCnc: 126 mg/dL
HEMOGLOBIN A1C: 6 % — AB (ref 4.8–5.6)

## 2017-07-07 ENCOUNTER — Encounter: Payer: Self-pay | Admitting: Internal Medicine

## 2017-07-10 ENCOUNTER — Encounter: Payer: Self-pay | Admitting: *Deleted

## 2017-08-17 ENCOUNTER — Telehealth: Payer: Self-pay | Admitting: *Deleted

## 2017-08-17 NOTE — Telephone Encounter (Signed)
Spoke with patient and informed him that NP will be out sick tomorrow. Gave him number, requested he call to reschedule. Advised him that the office will also reach out to him to reschedule if he has been unable to do so. He verbalized understanding of call.

## 2017-08-18 ENCOUNTER — Ambulatory Visit: Payer: Managed Care, Other (non HMO) | Admitting: Adult Health

## 2017-08-27 ENCOUNTER — Telehealth: Payer: Self-pay | Admitting: Family Medicine

## 2017-08-27 NOTE — Telephone Encounter (Signed)
Called and spoke to pt to remind them of their apt tomorrow. Advised of building number and time restrictions. °

## 2017-08-28 ENCOUNTER — Other Ambulatory Visit: Payer: Self-pay

## 2017-08-28 ENCOUNTER — Ambulatory Visit: Payer: Managed Care, Other (non HMO) | Admitting: Family Medicine

## 2017-08-28 ENCOUNTER — Encounter: Payer: Self-pay | Admitting: Family Medicine

## 2017-08-28 VITALS — BP 132/76 | HR 82 | Temp 98.4°F | Resp 18 | Ht 69.0 in | Wt 230.2 lb

## 2017-08-28 DIAGNOSIS — I10 Essential (primary) hypertension: Secondary | ICD-10-CM

## 2017-08-28 DIAGNOSIS — E782 Mixed hyperlipidemia: Secondary | ICD-10-CM

## 2017-08-28 DIAGNOSIS — R7303 Prediabetes: Secondary | ICD-10-CM

## 2017-08-28 DIAGNOSIS — Z114 Encounter for screening for human immunodeficiency virus [HIV]: Secondary | ICD-10-CM

## 2017-08-28 MED ORDER — ROSUVASTATIN CALCIUM 20 MG PO TABS: 20.0000 mg | ORAL_TABLET | Freq: Every day | ORAL | 1 refills | Status: DC

## 2017-08-28 MED ORDER — HYDROCHLOROTHIAZIDE 12.5 MG PO CAPS: 12.5000 mg | ORAL_CAPSULE | Freq: Every day | ORAL | 1 refills | Status: DC

## 2017-08-28 MED ORDER — AMLODIPINE BESYLATE 10 MG PO TABS: 10.0000 mg | ORAL_TABLET | Freq: Every day | ORAL | 1 refills | Status: DC

## 2017-08-28 NOTE — Progress Notes (Signed)
Subjective:  This chart was scribed for Seth Flood, MD by Veverly Fells, at Primary Care at [facility].  This patient was seen in room 14 and the patient's care was started at 10:28 AM.   Chief Complaint  Patient presents with  . Hypertension    and cholestrol follow up     Patient ID: Seth Carson, male    DOB: Sep 07, 1960, 57 y.o.   MRN: [id number]  HPI HPI Comments: Seth Carson is a 57 y.o. male who presents to Primary Care at [facility] for a follow up.    Hypertension: Last saw him in January and was controlled at that time although he had been out of medication and just taken HCTZ. Decided to restart Amlodipine only at 10 mg QD then if BP was increasing, restart Lisinopril HCT. ---- Patient has been getting home readings around 120-130/70-80.  Few 140's-150's. Denies any chest pains, lightheadedness, dizziness or dark tarry stools.  Lab Results  Component Value Date   CREATININE 1.05 07/01/2017   BP Readings from Last 3 Encounters:  08/28/17 132/76  07/01/17 132/82  08/12/16 (!) 146/90    Hyperlipidemia:  He has taken Crestor 20 mg QD.  He had been off meds at last visit for one week. Decided to delay testing until he had been back on medication.  Plans on testing today. --- Patient has been compliant with this medication and now takes it at bed time.  Lab Results  Component Value Date   CHOL 150 04/18/2016   HDL 35 (L) 04/18/2016   LDLCALC 63 04/18/2016   TRIG 259 (H) 04/18/2016   CHOLHDL 4.3 04/18/2016    Lab Results  Component Value Date   ALT 22 04/18/2016   AST 18 04/18/2016   ALKPHOS 63 04/18/2016   BILITOT 0.6 04/18/2016    Prediabetes: Recommended diet and exercise with repeat testing in three to six months.  Lab Results  Component Value Date   HGBA1C 6.0 (H) 07/01/2017   Wt Readings from Last 3 Encounters:  08/28/17 230 lb 3.2 oz (104.4 kg)  07/01/17 234 lb 6.4 oz (106.3 kg)  08/12/16 218 lb (98.9 kg)    Health maintenance: He will be  getting a colonscopy in the first of next month and will have routine HIV screening today.   Patient Active Problem List   Diagnosis Date Noted  . Unspecified sleep apnea 08/17/2013  . HTN (hypertension) 06/10/2013  . Hyperlipidemia 06/10/2013   Past Medical History:  Diagnosis Date  . Hyperlipidemia   . Hypertension    No past surgical history on file. No Known Allergies Prior to Admission medications   Medication Sig Start Date End Date Taking? Authorizing Provider  amLODipine (NORVASC) 10 MG tablet Take 1 tablet (10 mg total) by mouth daily. 07/01/17  Yes Seth Flood, MD  rosuvastatin (CRESTOR) 20 MG tablet Take 1 tablet (20 mg total) by mouth daily. 07/01/17  Yes Seth Flood, MD  lisinopril-hydrochlorothiazide (PRINZIDE,ZESTORETIC) 20-12.5 MG tablet Take 1 tablet by mouth daily. Patient not taking: Reported on 08/28/2017 07/01/17   Seth Flood, MD   Social History   Socioeconomic History  . Marital status: Single    Spouse name: n/a  . Number of children: 0  . Years of education: 68  . Highest education level: Not on file  Social Needs  . Financial resource strain: Not on file  . Food insecurity - worry: Not on file  . Food insecurity - inability: Not on file  .  Transportation needs - medical: Not on file  . Transportation needs - non-medical: Not on file  Occupational History  . Occupation: Systems developercopier technician  Tobacco Use  . Smoking status: Never Smoker  . Smokeless tobacco: Never Used  Substance and Sexual Activity  . Alcohol use: No  . Drug use: No  . Sexual activity: Not on file  Other Topics Concern  . Not on file  Social History Narrative   Marital status: single;       Children:  None       Lives with his mother "she won't let me leave."      Employment:  [employer] x 21 years; repairman; happy      Tobacco: none      Alcohol:  none      Exercise:  Sporadic.  Some walking.   Review of Systems  Constitutional: Negative for fatigue and  unexpected weight change.  Eyes: Negative for visual disturbance.  Respiratory: Negative for cough, chest tightness and shortness of breath.   Cardiovascular: Negative for chest pain, palpitations and leg swelling.  Gastrointestinal: Negative for abdominal pain and blood in stool.  Neurological: Negative for dizziness, light-headedness and headaches.       Objective:   Physical Exam  Constitutional: He is oriented to person, place, and time. He appears well-developed and well-nourished.  HENT:  Head: Normocephalic and atraumatic.  Eyes: EOM are normal. Pupils are equal, round, and reactive to light.  Neck: No JVD present. Carotid bruit is not present.  Cardiovascular: Normal rate, regular rhythm and normal heart sounds.  No murmur heard. Pulmonary/Chest: Effort normal and breath sounds normal. He has no rales.  Musculoskeletal: He exhibits no edema.  Neurological: He is alert and oriented to person, place, and time.  Skin: Skin is warm and dry.  Psychiatric: He has a normal mood and affect.  Vitals reviewed.   Vitals:   08/28/17 0956  BP: 132/76  Pulse: 82  Resp: 18  Temp: 98.4 F (36.9 C)  TempSrc: Oral  SpO2: 96%  Weight: 230 lb 3.2 oz (104.4 kg)  Height: 5\' 9"  (1.753 m)      Assessment & Plan:   [name] is a 57 y.o. male Essential hypertension - Plan: hydrochlorothiazide (MICROZIDE) 12.5 MG capsule, amLODipine (NORVASC) 10 MG tablet  - Borderline hemorrhage, overall controlled. Continue amlodipine 10 mg daily, if elevated home readings, can add HCTZ  Prediabetes  -Continue exercise, diet monitoring, recheck 6 months  Mixed hyperlipidemia - Plan: rosuvastatin (CRESTOR) 20 MG tablet, Lipid panel, Hepatic Function Panel  -Tolerating current dose, Continue Crestor, check lipid panel, hepatic function panel as now on medication consistently  Screening for HIV (human immunodeficiency virus) - Plan: HIV antibody  -Routine screening  Meds ordered this encounter   Medications  . hydrochlorothiazide (MICROZIDE) 12.5 MG capsule    Sig: Take 1 capsule (12.5 mg total) by mouth daily.    Dispense:  90 capsule    Refill:  1  . rosuvastatin (CRESTOR) 20 MG tablet    Sig: Take 1 tablet (20 mg total) by mouth daily.    Dispense:  90 tablet    Refill:  1  . amLODipine (NORVASC) 10 MG tablet    Sig: Take 1 tablet (10 mg total) by mouth daily.    Dispense:  90 tablet    Refill:  1   Patient Instructions    If readings at home remain over 140/90, then add hydrochlorothiazide. Otherwise continue amlodipine once per day for  now.   I will check cholesterol levels today. No change in Crestor dose for now.   Recheck in 6 months. Thanks for coming in today.    IF you received an x-ray today, you will receive an invoice from [facility]. Please contact [facility] at [phone] with questions or concerns regarding your invoice.   IF you received labwork today, you will receive an invoice from [facility]. Please contact [facility] at [phone] with questions or concerns regarding your invoice.   Our billing staff will not be able to assist you with questions regarding bills from these companies.  You will be contacted with the lab results as soon as they are available. The fastest way to get your results is to activate your My Chart account. Instructions are located on the last page of this paperwork. If you have not heard from [place] regarding the results in 2 weeks, please contact this office.      I personally performed the services described in this documentation, which was scribed in my presence. The recorded information has been reviewed and considered for accuracy and completeness, addended by me as needed, and agree with information above.  Signed,   Meredith Staggers, MD Primary Care at [facility].  08/29/17 4:21 PM

## 2017-08-28 NOTE — Patient Instructions (Addendum)
  If readings at home remain over 140/90, then add hydrochlorothiazide. Otherwise continue amlodipine once per day for now.   I will check cholesterol levels today. No change in Crestor dose for now.   Recheck in 6 months. Thanks for coming in today.    IF you received an x-ray today, you will receive an invoice from [facility]. Please contact [facility] at [phone] with questions or concerns regarding your invoice.   IF you received labwork today, you will receive an invoice from [facility]. Please contact [facility] at [phone] with questions or concerns regarding your invoice.   Our billing staff will not be able to assist you with questions regarding bills from these companies.  You will be contacted with the lab results as soon as they are available. The fastest way to get your results is to activate your My Chart account. Instructions are located on the last page of this paperwork. If you have not heard from us regarding the results in 2 weeks, please contact this office.

## 2017-08-29 ENCOUNTER — Ambulatory Visit (AMBULATORY_SURGERY_CENTER): Payer: Self-pay | Admitting: *Deleted

## 2017-08-29 ENCOUNTER — Other Ambulatory Visit: Payer: Self-pay

## 2017-08-29 VITALS — Ht 69.0 in | Wt 232.0 lb

## 2017-08-29 DIAGNOSIS — Z1211 Encounter for screening for malignant neoplasm of colon: Secondary | ICD-10-CM

## 2017-08-29 LAB — LIPID PANEL
CHOL/HDL RATIO: 3.3 ratio (ref 0.0–5.0)
Cholesterol, Total: 137 mg/dL (ref 100–199)
HDL: 42 mg/dL (ref >39)
LDL CALC: 70 mg/dL (ref 0–99)
TRIGLYCERIDES: 125 mg/dL (ref 0–149)
VLDL Cholesterol Cal: 25 mg/dL (ref 5–40)

## 2017-08-29 LAB — HEPATIC FUNCTION PANEL
ALT: 35 IU/L (ref 0–44)
AST: 26 IU/L (ref 0–40)
Albumin: 4.6 g/dL (ref 3.5–5.5)
Alkaline Phosphatase: 79 IU/L (ref 39–117)
Bilirubin Total: 0.7 mg/dL (ref 0.0–1.2)
Bilirubin, Direct: 0.17 mg/dL (ref 0.00–0.40)
TOTAL PROTEIN: 7.4 g/dL (ref 6.0–8.5)

## 2017-08-29 LAB — HIV ANTIBODY (ROUTINE TESTING W REFLEX): HIV SCREEN 4TH GENERATION: NONREACTIVE

## 2017-08-29 NOTE — Progress Notes (Signed)
Patient denies any allergies to eggs or soy. Patient denies any problems with anesthesia/sedation. Patient denies any oxygen use at home. Patient denies taking any diet/weight loss medications or blood thinners. EMMI education assisgned to patient on colonoscopy, this was explained and instructions given to patient. 

## 2017-09-08 ENCOUNTER — Encounter: Payer: Self-pay | Admitting: Internal Medicine

## 2017-09-22 ENCOUNTER — Encounter: Payer: Self-pay | Admitting: Internal Medicine

## 2017-09-22 ENCOUNTER — Ambulatory Visit (AMBULATORY_SURGERY_CENTER): Payer: Managed Care, Other (non HMO) | Admitting: Internal Medicine

## 2017-09-22 ENCOUNTER — Other Ambulatory Visit: Payer: Self-pay

## 2017-09-22 VITALS — BP 139/94 | HR 66 | Temp 98.4°F | Resp 15 | Ht 69.0 in | Wt 232.0 lb

## 2017-09-22 DIAGNOSIS — Z1212 Encounter for screening for malignant neoplasm of rectum: Secondary | ICD-10-CM | POA: Diagnosis not present

## 2017-09-22 DIAGNOSIS — Z1211 Encounter for screening for malignant neoplasm of colon: Secondary | ICD-10-CM

## 2017-09-22 MED ORDER — SODIUM CHLORIDE 0.9 % IV SOLN: 500.0000 mL | Freq: Once | INTRAVENOUS | Status: DC

## 2017-09-22 NOTE — Progress Notes (Signed)
Report given to PACU, vss 

## 2017-09-22 NOTE — Op Note (Signed)
[facility] Patient Name: Seth Carson Procedure Date: 09/22/2017 11:36 AM MRN: [id number] Endoscopist: Iva Booparl E Gessner , MD Age: 57 Referring MD:  Date of Birth: 29-Jun-1960 Gender: Male Account #: [id number] Procedure:                Colonoscopy Indications:              Screening for colorectal malignant neoplasm, This                            is the patient's first colonoscopy Medicines:                Propofol per Anesthesia, Monitored Anesthesia Care Procedure:                Pre-Anesthesia Assessment:                           - Prior to the procedure, a History and Physical                            was performed, and patient medications and                            allergies were reviewed. The patient's tolerance of                            previous anesthesia was also reviewed. The risks                            and benefits of the procedure and the sedation                            options and risks were discussed with the patient.                            All questions were answered, and informed consent                            was obtained. Prior Anticoagulants: The patient has                            taken no previous anticoagulant or antiplatelet                            agents. ASA Grade Assessment: II - A patient with                            mild systemic disease. After reviewing the risks                            and benefits, the patient was deemed in                            satisfactory condition to undergo the procedure.  After obtaining informed consent, the colonoscope                            was passed under direct vision. Throughout the                            procedure, the patient's blood pressure, pulse, and                            oxygen saturations were monitored continuously. The                            Colonoscope was introduced through the anus and   advanced to the the cecum, identified by                            appendiceal orifice and ileocecal valve. The                            colonoscopy was performed without difficulty. The                            patient tolerated the procedure well. The quality                            of the bowel preparation was good. The bowel                            preparation used was Miralax. The ileocecal valve,                            appendiceal orifice, and rectum were photographed. Scope In: 11:49:54 AM Scope Out: 12:02:47 PM Scope Withdrawal Time: 0 hours 10 minutes 44 seconds  Total Procedure Duration: 0 hours 12 minutes 53 seconds  Findings:                 The perianal and digital rectal examinations were                            normal. Pertinent negatives include normal prostate                            (size, shape, and consistency).                           Diverticula were found in the sigmoid colon.                           The exam was otherwise without abnormality on                            direct and retroflexion views. Complications:            No immediate complications. Estimated Blood Loss:     Estimated blood loss: none. Impression:               -  Diverticulosis in the sigmoid colon.                           - The examination was otherwise normal on direct                            and retroflexion views.                           - No specimens collected. Recommendation:           - Patient has a contact number available for                            emergencies. The signs and symptoms of potential                            delayed complications were discussed with the                            patient. Return to normal activities tomorrow.                            Written discharge instructions were provided to the                            patient.                           - Resume previous diet.                           - Continue  present medications.                           - Repeat colonoscopy in 10 years for screening                            purposes. Iva Boop, MD 09/22/2017 12:06:51 PM This report has been signed electronically.

## 2017-09-22 NOTE — Progress Notes (Signed)
Pt's states no medical or surgical changes since previsit or office visit. 

## 2017-09-22 NOTE — Patient Instructions (Addendum)
No polyps or cancer seen. You do have diverticulosis - thickened muscle rings and pouches in the colon wall. Please read the handout about this condition.  Next routine colonoscopy or other screening test in 10 years - 2029  I appreciate the opportunity to care for you. Iva Booparl E. Gessner, MD, FACG  YOU HAD AN ENDOSCOPIC PROCEDURE TODAY AT THE [facility]:   Refer to the procedure report that was given to you for any specific questions about what was found during the examination.  If the procedure report does not answer your questions, please call your gastroenterologist to clarify.  If you requested that your care partner not be given the details of your procedure findings, then the procedure report has been included in a sealed envelope for you to review at your convenience later.  YOU SHOULD EXPECT: Some feelings of bloating in the abdomen. Passage of more gas than usual.  Walking can help get rid of the air that was put into your GI tract during the procedure and reduce the bloating. If you had a lower endoscopy (such as a colonoscopy or flexible sigmoidoscopy) you may notice spotting of blood in your stool or on the toilet paper. If you underwent a bowel prep for your procedure, you may not have a normal bowel movement for a few days.  Please Note:  You might notice some irritation and congestion in your nose or some drainage.  This is from the oxygen used during your procedure.  There is no need for concern and it should clear up in a day or so.  SYMPTOMS TO REPORT IMMEDIATELY:   Following lower endoscopy (colonoscopy or flexible sigmoidoscopy):  Excessive amounts of blood in the stool  Significant tenderness or worsening of abdominal pains  Swelling of the abdomen that is new, acute  Fever of 100F or higher  For urgent or emergent issues, a gastroenterologist can be reached at any hour by calling [phone].   DIET:  We do recommend a small meal at first, but  then you may proceed to your regular diet.  Drink plenty of fluids but you should avoid alcoholic beverages for 24 hours.  ACTIVITY:  You should plan to take it easy for the rest of today and you should NOT DRIVE or use heavy machinery until tomorrow (because of the sedation medicines used during the test).    FOLLOW UP: Our staff will call the number listed on your records the next business day following your procedure to check on you and address any questions or concerns that you may have regarding the information given to you following your procedure. If we do not reach you, we will leave a message.  However, if you are feeling well and you are not experiencing any problems, there is no need to return our call.  We will assume that you have returned to your regular daily activities without incident.  If any biopsies were taken you will be contacted by phone or by letter within the next 1-3 weeks.  Please call us at [phone] if you have not heard about the biopsies in 3 weeks.   Handout given on diverticulosis **  SIGNATURES/CONFIDENTIALITY: You and/or your care partner have signed paperwork which will be entered into your electronic medical record.  These signatures attest to the fact that that the information above on your After Visit Summary has been reviewed and is understood.  Full responsibility of the confidentiality of this discharge information lies with you  and/or your care-partner. 

## 2017-09-23 ENCOUNTER — Telehealth: Payer: Self-pay

## 2017-09-23 NOTE — Telephone Encounter (Signed)
  Follow up Call-  Call back number 09/22/2017  Post procedure Call Back phone  # #[phone] cell  Permission to leave phone message Yes  Some recent data might be hidden     Patient questions:  Do you have a fever, pain , or abdominal swelling? No. Pain Score  0 *  Have you tolerated food without any problems? Yes.    Have you been able to return to your normal activities? Yes.    Do you have any questions about your discharge instructions: Diet   No. Medications  No. Follow up visit  No.  Do you have questions or concerns about your Care? No.  Actions: * If pain score is 4 or above: No action needed, pain <4.

## 2017-09-23 NOTE — Telephone Encounter (Signed)
Attempted to reach patient for post-procedure f/u call. No answer. Left message that we will make another attempt to reach him again later today and for him to please not hesitate to call us if he has any questions/concerns regarding his care. 

## 2017-11-17 ENCOUNTER — Encounter: Payer: Self-pay | Admitting: Family Medicine

## 2018-07-28 ENCOUNTER — Encounter: Payer: Self-pay | Admitting: Family Medicine

## 2018-07-28 ENCOUNTER — Other Ambulatory Visit: Payer: Self-pay

## 2018-07-28 ENCOUNTER — Ambulatory Visit: Payer: Managed Care, Other (non HMO) | Admitting: Family Medicine

## 2018-07-28 VITALS — BP 140/82 | HR 61 | Temp 98.4°F | Resp 18 | Ht 69.0 in | Wt 234.2 lb

## 2018-07-28 DIAGNOSIS — Z23 Encounter for immunization: Secondary | ICD-10-CM | POA: Diagnosis not present

## 2018-07-28 DIAGNOSIS — I10 Essential (primary) hypertension: Secondary | ICD-10-CM

## 2018-07-28 DIAGNOSIS — R350 Frequency of micturition: Secondary | ICD-10-CM | POA: Diagnosis not present

## 2018-07-28 DIAGNOSIS — E782 Mixed hyperlipidemia: Secondary | ICD-10-CM | POA: Diagnosis not present

## 2018-07-28 LAB — COMPREHENSIVE METABOLIC PANEL
ALT: 22 IU/L (ref 0–44)
AST: 16 IU/L (ref 0–40)
Albumin/Globulin Ratio: 1.5 (ref 1.2–2.2)
Albumin: 4.5 g/dL (ref 3.8–4.9)
Alkaline Phosphatase: 93 IU/L (ref 39–117)
BUN/Creatinine Ratio: 10 (ref 9–20)
BUN: 12 mg/dL (ref 6–24)
Bilirubin Total: 0.7 mg/dL (ref 0.0–1.2)
CALCIUM: 9.6 mg/dL (ref 8.7–10.2)
CO2: 22 mmol/L (ref 20–29)
Chloride: 101 mmol/L (ref 96–106)
Creatinine, Ser: 1.16 mg/dL (ref 0.76–1.27)
GFR calc Af Amer: 81 mL/min/{1.73_m2} (ref >59)
GFR calc non Af Amer: 70 mL/min/{1.73_m2} (ref >59)
Globulin, Total: 3.1 g/dL (ref 1.5–4.5)
Glucose: 103 mg/dL — ABNORMAL HIGH (ref 65–99)
Potassium: 3.2 mmol/L — ABNORMAL LOW (ref 3.5–5.2)
Sodium: 144 mmol/L (ref 134–144)
Total Protein: 7.6 g/dL (ref 6.0–8.5)

## 2018-07-28 LAB — LIPID PANEL
CHOL/HDL RATIO: 4 ratio (ref 0.0–5.0)
Cholesterol, Total: 161 mg/dL (ref 100–199)
HDL: 40 mg/dL (ref >39)
LDL Calculated: 83 mg/dL (ref 0–99)
Triglycerides: 191 mg/dL — ABNORMAL HIGH (ref 0–149)
VLDL Cholesterol Cal: 38 mg/dL (ref 5–40)

## 2018-07-28 MED ORDER — HYDROCHLOROTHIAZIDE 12.5 MG PO CAPS: 12.5000 mg | ORAL_CAPSULE | Freq: Every day | ORAL | 1 refills | Status: DC

## 2018-07-28 MED ORDER — AMLODIPINE BESYLATE 10 MG PO TABS: 10.0000 mg | ORAL_TABLET | Freq: Every day | ORAL | 1 refills | Status: DC

## 2018-07-28 MED ORDER — AMLODIPINE BESYLATE 10 MG PO TABS: 10.0000 mg | ORAL_TABLET | Freq: Every day | ORAL | 2 refills | Status: DC

## 2018-07-28 MED ORDER — ROSUVASTATIN CALCIUM 20 MG PO TABS: 20.0000 mg | ORAL_TABLET | Freq: Every day | ORAL | 1 refills | Status: DC

## 2018-07-28 MED ORDER — ROSUVASTATIN CALCIUM 20 MG PO TABS: 20.0000 mg | ORAL_TABLET | Freq: Every day | ORAL | 2 refills | Status: DC

## 2018-07-28 MED ORDER — HYDROCHLOROTHIAZIDE 12.5 MG PO CAPS: 12.5000 mg | ORAL_CAPSULE | Freq: Every day | ORAL | 2 refills | Status: DC

## 2018-07-28 NOTE — Progress Notes (Signed)
Subjective:    Patient ID: Seth Carson, male    DOB: 09/19/1960, 58 y.o.   MRN: [id number]  HPI Seth Carson is a 58 y.o. male Presents today for: Chief Complaint  Patient presents with  . Hypertension    need refill on  medication already pend  . Hyperlipidemia    need refill om medicaton already pend   Hypertension: BP Readings from Last 3 Encounters:  07/28/18 140/82  09/22/17 (!) 139/94  08/28/17 132/76   Lab Results  Component Value Date   CREATININE 1.05 07/01/2017  norvasc 10mg  per day, hctz 12.5 mg qd - started back about 6 weeks ago - has caused some increased urination frequently, but calmed down. Normal now.  Home BP 120/60's.   Urinary frequency: About 2 months ago, but no nocturia. Noticed with activity. Started prior to use of HCTZ. Now improved - back to baseline. Not having urinary difficulties now.   Hyperlipidemia: On Crestor 20mg  QD. No new side effects.  Lab Results  Component Value Date   CHOL 137 08/28/2017   HDL 42 08/28/2017   LDLCALC 70 08/28/2017   TRIG 125 08/28/2017   CHOLHDL 3.3 08/28/2017   Lab Results  Component Value Date   ALT 35 08/28/2017   AST 26 08/28/2017   ALKPHOS 79 08/28/2017   BILITOT 0.7 08/28/2017   HM: Flu vaccine today.    Patient Active Problem List   Diagnosis Date Noted  . Unspecified sleep apnea 08/17/2013  . HTN (hypertension) 06/10/2013  . Hyperlipidemia 06/10/2013   Past Medical History:  Diagnosis Date  . Hyperlipidemia   . Hypertension   . Sleep apnea    uses BI-PAP    Past Surgical History:  Procedure Laterality Date  . DENTAL EXAMINATION UNDER ANESTHESIA  at age 32   No Known Allergies Prior to Admission medications   Medication Sig Start Date End Date Taking? Authorizing Provider  amLODipine (NORVASC) 10 MG tablet Take 1 tablet (10 mg total) by mouth daily. 07/28/18  Yes [name], MD  aspirin EC 81 MG tablet Take 81 mg by mouth daily.   Yes [provider]    hydrochlorothiazide (MICROZIDE) 12.5 MG capsule Take 1 capsule (12.5 mg total) by mouth daily. 07/28/18  Yes [name], MD  rosuvastatin (CRESTOR) 20 MG tablet Take 1 tablet (20 mg total) by mouth daily. 07/28/18  Yes [name], MD   Social History   Socioeconomic History  . Marital status: Single    Spouse name: n/a  . Number of children: 0  . Years of education: 38  . Highest education level: Not on file  Occupational History  . Occupation: Systems developer  Social Needs  . Financial resource strain: Not on file  . Food insecurity:    Worry: Not on file    Inability: Not on file  . Transportation needs:    Medical: Not on file    Non-medical: Not on file  Tobacco Use  . Smoking status: Never Smoker  . Smokeless tobacco: Never Used  Substance and Sexual Activity  . Alcohol use: Yes    Comment: rarely  . Drug use: No  . Sexual activity: Not on file  Lifestyle  . Physical activity:    Days per week: Not on file    Minutes per session: Not on file  . Stress: Not on file  Relationships  . Social connections:    Talks on phone: Not on file    Gets together:  Not on file    Attends religious service: Not on file    Active member of club or organization: Not on file    Attends meetings of clubs or organizations: Not on file    Relationship status: Not on file  . Intimate partner violence:    Fear of current or ex partner: Not on file    Emotionally abused: Not on file    Physically abused: Not on file    Forced sexual activity: Not on file  Other Topics Concern  . Not on file  Social History Narrative   Marital status: single;       Children:  None       Lives with his mother "she won't let me leave."      Employment:  [employer] x 21 years; repairman; happy      Tobacco: none      Alcohol:  none      Exercise:  Sporadic.  Some walking.    Review of Systems  Constitutional: Negative for fatigue and unexpected weight change.  Eyes: Negative for  visual disturbance.  Respiratory: Negative for cough, chest tightness and shortness of breath.   Cardiovascular: Negative for chest pain, palpitations and leg swelling.  Gastrointestinal: Negative for abdominal pain and blood in stool.  Neurological: Negative for dizziness, light-headedness and headaches.       Objective:   Physical Exam Vitals signs reviewed.  Constitutional:      Appearance: He is well-developed.  HENT:     Head: Normocephalic and atraumatic.  Eyes:     Pupils: Pupils are equal, round, and reactive to light.  Neck:     Vascular: No carotid bruit or JVD.  Cardiovascular:     Rate and Rhythm: Normal rate and regular rhythm.     Heart sounds: Normal heart sounds. No murmur.  Pulmonary:     Effort: Pulmonary effort is normal.     Breath sounds: Normal breath sounds. No rales.  Skin:    General: Skin is warm and dry.  Neurological:     Mental Status: He is alert and oriented to person, place, and time.    Vitals:   07/28/18 0822 07/28/18 0829  BP: (!) 152/87 140/82  Pulse: 61   Resp: 18   Temp: 98.4 F (36.9 C)   TempSrc: Oral   SpO2: 98%   Weight: 234 lb 3.2 oz (106.2 kg)   Height: 5\' 9"  (1.753 m)        Assessment & Plan:   Seth Carson is a 58 y.o. male Mixed hyperlipidemia - Plan: Lipid Panel, rosuvastatin (CRESTOR) 20 MG tablet, DISCONTINUED: rosuvastatin (CRESTOR) 20 MG tablet  -  Stable, tolerating current regimen. Medications refilled. Labs pending as above.   Need for prophylactic vaccination and inoculation against influenza - Plan: Flu Vaccine QUAD 6+ mos PF IM (Fluarix Quad PF)  Essential hypertension - Plan: Basic Metabolic Panel, amLODipine (NORVASC) 10 MG tablet, hydrochlorothiazide (MICROZIDE) 12.5 MG capsule, DISCONTINUED: amLODipine (NORVASC) 10 MG tablet, DISCONTINUED: hydrochlorothiazide (MICROZIDE) 12.5 MG capsule  -  Stable on home readings, tolerating current regimen. Medications refilled. Labs pending as above.   Frequent  urination  - now resolved.  Less likely HCTZ as started prior to restarting that medication.  Noted with activity but not nocturia.  Now back to baseline.  If symptoms return, check urinalysis, consider PSA testing as well.  RTC precautions.   Meds ordered this encounter  Medications  . DISCONTD: amLODipine (NORVASC) 10 MG tablet  Sig: Take 1 tablet (10 mg total) by mouth daily.    Dispense:  90 tablet    Refill:  1  . DISCONTD: hydrochlorothiazide (MICROZIDE) 12.5 MG capsule    Sig: Take 1 capsule (12.5 mg total) by mouth daily.    Dispense:  90 capsule    Refill:  1  . DISCONTD: rosuvastatin (CRESTOR) 20 MG tablet    Sig: Take 1 tablet (20 mg total) by mouth daily.    Dispense:  90 tablet    Refill:  1  . amLODipine (NORVASC) 10 MG tablet    Sig: Take 1 tablet (10 mg total) by mouth daily.    Dispense:  90 tablet    Refill:  2  . hydrochlorothiazide (MICROZIDE) 12.5 MG capsule    Sig: Take 1 capsule (12.5 mg total) by mouth daily.    Dispense:  90 capsule    Refill:  2  . rosuvastatin (CRESTOR) 20 MG tablet    Sig: Take 1 tablet (20 mg total) by mouth daily.    Dispense:  90 tablet    Refill:  2   Patient Instructions     Blood pressure is borderline today but based on your home readings okay to continue the amlodipine and hydrochlorothiazide same dose for now.  If the frequent urination returns please return for other testing.   Thanks for coming in today. Recheck in 6 months for a physical.   Return to the clinic or go to the nearest emergency room if any of your symptoms worsen or new symptoms occur.   If you have lab work done today you will be contacted with your lab results within the next 2 weeks.  If you have not heard from us then please contact us. The fastest way to get your results is to register for My Chart.   IF you received an x-ray today, you will receive an invoice from [facility]. Please contact [facility] at [phone] with  questions or concerns regarding your invoice.   IF you received labwork today, you will receive an invoice from [facility]. Please contact [facility] at [phone] with questions or concerns regarding your invoice.   Our billing staff will not be able to assist you with questions regarding bills from these companies.  You will be contacted with the lab results as soon as they are available. The fastest way to get your results is to activate your My Chart account. Instructions are located on the last page of this paperwork. If you have not heard from us regarding the results in 2 weeks, please contact this office.       Signed,   Meredith StaggersJeffrey Ivo Moga, MD Primary Care at [facility].  07/28/18 9:08 AM

## 2018-07-28 NOTE — Patient Instructions (Addendum)
   Blood pressure is borderline today but based on your home readings okay to continue the amlodipine and hydrochlorothiazide same dose for now.  If the frequent urination returns please return for other testing.   Thanks for coming in today. Recheck in 6 months for a physical.   Return to the clinic or go to the nearest emergency room if any of your symptoms worsen or new symptoms occur.   If you have lab work done today you will be contacted with your lab results within the next 2 weeks.  If you have not heard from [place] then please contact us. The fastest way to get your results is to register for My Chart.   IF you received an x-ray today, you will receive an invoice from [facility]. Please contact [facility] at [phone] with questions or concerns regarding your invoice.   IF you received labwork today, you will receive an invoice from [place]. Please contact [facility] at [phone] with questions or concerns regarding your invoice.   Our billing staff will not be able to assist you with questions regarding bills from these companies.  You will be contacted with the lab results as soon as they are available. The fastest way to get your results is to activate your My Chart account. Instructions are located on the last page of this paperwork. If you have not heard from [place] regarding the results in 2 weeks, please contact this office.

## 2018-08-09 ENCOUNTER — Other Ambulatory Visit: Payer: Self-pay | Admitting: Family Medicine

## 2018-08-09 DIAGNOSIS — E876 Hypokalemia: Secondary | ICD-10-CM

## 2018-08-09 NOTE — Progress Notes (Signed)
See lab notes

## 2018-08-28 ENCOUNTER — Other Ambulatory Visit: Payer: Self-pay

## 2018-08-28 ENCOUNTER — Ambulatory Visit: Payer: Managed Care, Other (non HMO) | Admitting: Family Medicine

## 2018-08-28 ENCOUNTER — Encounter: Payer: Self-pay | Admitting: Family Medicine

## 2018-08-28 VITALS — BP 132/79 | HR 81 | Temp 98.9°F | Resp 16 | Ht 69.0 in | Wt 231.8 lb

## 2018-08-28 DIAGNOSIS — E876 Hypokalemia: Secondary | ICD-10-CM | POA: Diagnosis not present

## 2018-08-28 DIAGNOSIS — R059 Cough, unspecified: Secondary | ICD-10-CM

## 2018-08-28 DIAGNOSIS — R05 Cough: Secondary | ICD-10-CM

## 2018-08-28 MED ORDER — BENZONATATE 100 MG PO CAPS: 100.0000 mg | ORAL_CAPSULE | Freq: Three times a day (TID) | ORAL | 0 refills | Status: DC | PRN

## 2018-08-28 NOTE — Progress Notes (Signed)
Subjective:    Patient ID: Seth Carson, male    DOB: 10/26/1960, 58 y.o.   MRN: [id number]  HPI Seth Carson is a 58 y.o. male Presents today for: Chief Complaint  Patient presents with  . Cough    been doing on for 1 wk a little better but will not go away   Cough started early last week, some productive yellow mucus. Took some cough syrup.  Overall improved,  more dry cough now. Clear mucus in the morning. No fever. Possible sick contacts - kids at house with cold symptoms. No runny nose. No sore throat. No wheeze/dyspnea.  Eating and drinking ok.   Tx; delsym.   Hypokalemia: Potassium 3.2 on 2/4. Feeling ok. No heart palpitations. Plan on repeat testing today.    Patient Active Problem List   Diagnosis Date Noted  . Unspecified sleep apnea 08/17/2013  . HTN (hypertension) 06/10/2013  . Hyperlipidemia 06/10/2013   Past Medical History:  Diagnosis Date  . Hyperlipidemia   . Hypertension   . Sleep apnea    uses BI-PAP    Past Surgical History:  Procedure Laterality Date  . DENTAL EXAMINATION UNDER ANESTHESIA  at age 65   No Known Allergies Prior to Admission medications   Medication Sig Start Date End Date Taking? Authorizing Provider  amLODipine (NORVASC) 10 MG tablet Take 1 tablet (10 mg total) by mouth daily. 07/28/18  Yes [name], MD  aspirin EC 81 MG tablet Take 81 mg by mouth daily.   Yes [provider]  hydrochlorothiazide (MICROZIDE) 12.5 MG capsule Take 1 capsule (12.5 mg total) by mouth daily. 07/28/18  Yes [name], MD  rosuvastatin (CRESTOR) 20 MG tablet Take 1 tablet (20 mg total) by mouth daily. 07/28/18  Yes [name], MD   Social History   Socioeconomic History  . Marital status: Single    Spouse name: n/a  . Number of children: 0  . Years of education: 55  . Highest education level: Not on file  Occupational History  . Occupation: Systems developer  Social Needs  . Financial resource strain: Not on file  .  Food insecurity:    Worry: Not on file    Inability: Not on file  . Transportation needs:    Medical: Not on file    Non-medical: Not on file  Tobacco Use  . Smoking status: Never Smoker  . Smokeless tobacco: Never Used  Substance and Sexual Activity  . Alcohol use: Yes    Comment: rarely  . Drug use: No  . Sexual activity: Not on file  Lifestyle  . Physical activity:    Days per week: Not on file    Minutes per session: Not on file  . Stress: Not on file  Relationships  . Social connections:    Talks on phone: Not on file    Gets together: Not on file    Attends religious service: Not on file    Active member of club or organization: Not on file    Attends meetings of clubs or organizations: Not on file    Relationship status: Not on file  . Intimate partner violence:    Fear of current or ex partner: Not on file    Emotionally abused: Not on file    Physically abused: Not on file    Forced sexual activity: Not on file  Other Topics Concern  . Not on file  Social History Narrative   Marital status: single;  Children:  None       Lives with his mother "she won't let me leave."      Employment:  [employer] x 21 years; repairman; happy      Tobacco: none      Alcohol:  none      Exercise:  Sporadic.  Some walking.    Review of Systems Per HPI.     Objective:   Physical Exam Vitals signs reviewed.  Constitutional:      Appearance: He is well-developed.  HENT:     Head: Normocephalic and atraumatic.     Right Ear: Tympanic membrane, ear canal and external ear normal.     Left Ear: Tympanic membrane, ear canal and external ear normal.     Nose: No rhinorrhea.     Mouth/Throat:     Pharynx: No oropharyngeal exudate or posterior oropharyngeal erythema.  Eyes:     Conjunctiva/sclera: Conjunctivae normal.     Pupils: Pupils are equal, round, and reactive to light.  Neck:     Musculoskeletal: Neck supple.  Cardiovascular:     Rate and Rhythm: Normal  rate and regular rhythm.     Heart sounds: Normal heart sounds. No murmur.  Pulmonary:     Effort: Pulmonary effort is normal.     Breath sounds: Normal breath sounds. No wheezing, rhonchi or rales.  Abdominal:     Palpations: Abdomen is soft.     Tenderness: There is no abdominal tenderness.  Lymphadenopathy:     Cervical: No cervical adenopathy.  Skin:    General: Skin is warm and dry.     Findings: No rash.  Neurological:     Mental Status: He is alert and oriented to person, place, and time.  Psychiatric:        Behavior: Behavior normal.    Vitals:   08/28/18 1100  BP: 132/79  Pulse: 81  Resp: 16  Temp: 98.9 F (37.2 C)  TempSrc: Oral  SpO2: 97%  Weight: 231 lb 12.8 oz (105.1 kg)  Height: 5\' 9"  (1.753 m)   Results for orders placed or performed in visit on 08/28/18  Basic metabolic panel  Result Value Ref Range   Glucose 103 (H) 65 - 99 mg/dL   BUN 10 6 - 24 mg/dL   Creatinine, Ser 6.94 0.76 - 1.27 mg/dL   GFR calc non Af Amer 99 >59 mL/min/1.73   GFR calc Af Amer 114 >59 mL/min/1.73   BUN/Creatinine Ratio 12 9 - 20   Sodium 144 134 - 144 mmol/L   Potassium 3.3 (L) 3.5 - 5.2 mmol/L   Chloride 100 96 - 106 mmol/L   CO2 23 20 - 29 mmol/L   Calcium 9.5 8.7 - 10.2 mg/dL       Assessment & Plan:    Seth Carson is a 58 y.o. male Cough - Plan: benzonatate (TESSALON) 100 MG capsule  -Suspected viral illness that is improving.  Tessalon Perles as needed for cough, other symptomatic care and RTC precautions  Hypokalemia - Plan: Basic metabolic panel  -Repeat testing performed, improved but still mild hypokalemia.  Likely related to HCTZ.  Will recommend initial high potassium foods in diet with repeat testing in the next 1 to 2 weeks.  Meds ordered this encounter  Medications  . benzonatate (TESSALON) 100 MG capsule    Sig: Take 1 capsule (100 mg total) by mouth 3 (three) times daily as needed for cough.    Dispense:  20 capsule  Refill:  0   Patient  Instructions   I am glad to hear the cough is improving.  It was likely related to a viral illness that should continue to improve.  Tessalon Perles up to 3 times per day as needed, or over-the-counter Mucinex or Mucinex DM.  Follow-up if symptoms do not continue to improve.  I will recheck the potassium level on your blood work today and let you know if there are any concerns.  Thank you for coming in today   Cough, Adult  Coughing is a reflex that clears your throat and your airways. Coughing helps to heal and protect your lungs. It is normal to cough occasionally, but a cough that happens with other symptoms or lasts a long time may be a sign of a condition that needs treatment. A cough may last only 2-3 weeks (acute), or it may last longer than 8 weeks (chronic). What are the causes? Coughing is commonly caused by:  Breathing in substances that irritate your lungs.  A viral or bacterial respiratory infection.  Allergies.  Asthma.  Postnasal drip.  Smoking.  Acid backing up from the stomach into the esophagus (gastroesophageal reflux).  Certain medicines.  Chronic lung problems, including COPD (or rarely, lung cancer).  Other medical conditions such as heart failure. Follow these instructions at home: Pay attention to any changes in your symptoms. Take these actions to help with your discomfort:  Take medicines only as told by your health care provider. ? If you were prescribed an antibiotic medicine, take it as told by your health care provider. Do not stop taking the antibiotic even if you start to feel better. ? Talk with your health care provider before you take a cough suppressant medicine.  Drink enough fluid to keep your urine clear or pale yellow.  If the air is dry, use a cold steam vaporizer or humidifier in your bedroom or your home to help loosen secretions.  Avoid anything that causes you to cough at work or at home.  If your cough is worse at night, try  sleeping in a semi-upright position.  Avoid cigarette smoke. If you smoke, quit smoking. If you need help quitting, ask your health care provider.  Avoid caffeine.  Avoid alcohol.  Rest as needed. Contact a health care provider if:  You have new symptoms.  You cough up pus.  Your cough does not get better after 2-3 weeks, or your cough gets worse.  You cannot control your cough with suppressant medicines and you are losing sleep.  You develop pain that is getting worse or pain that is not controlled with pain medicines.  You have a fever.  You have unexplained weight loss.  You have night sweats. Get help right away if:  You cough up blood.  You have difficulty breathing.  Your heartbeat is very fast. This information is not intended to replace advice given to you by your health care provider. Make sure you discuss any questions you have with your health care provider. Document Released: 12/07/2010 Document Revised: 11/16/2015 Document Reviewed: 08/17/2014 Elsevier Interactive Patient Education  [employer].   If you have lab work done today you will be contacted with your lab results within the next 2 weeks.  If you have not heard from [place] then please contact us. The fastest way to get your results is to register for My Chart.   IF you received an x-ray today, you will receive an invoice from [facility]. Please  contact [facility] at [phone] with questions or concerns regarding your invoice.   IF you received labwork today, you will receive an invoice from [place]. Please contact [facility] at [phone] with questions or concerns regarding your invoice.   Our billing staff will not be able to assist you with questions regarding bills from these companies.  You will be contacted with the lab results as soon as they are available. The fastest way to get your results is to activate your My Chart account. Instructions are located on the last  page of this paperwork. If you have not heard from [place] regarding the results in 2 weeks, please contact this office.       Signed,   Meredith Staggers, MD Primary Care at [facility].  08/30/18 5:20 PM

## 2018-08-28 NOTE — Patient Instructions (Addendum)
I am glad to hear the cough is improving.  It was likely related to a viral illness that should continue to improve.  Tessalon Perles up to 3 times per day as needed, or over-the-counter Mucinex or Mucinex DM.  Follow-up if symptoms do not continue to improve.  I will recheck the potassium level on your blood work today and let you know if there are any concerns.  Thank you for coming in today   Cough, Adult  Coughing is a reflex that clears your throat and your airways. Coughing helps to heal and protect your lungs. It is normal to cough occasionally, but a cough that happens with other symptoms or lasts a long time may be a sign of a condition that needs treatment. A cough may last only 2-3 weeks (acute), or it may last longer than 8 weeks (chronic). What are the causes? Coughing is commonly caused by:  Breathing in substances that irritate your lungs.  A viral or bacterial respiratory infection.  Allergies.  Asthma.  Postnasal drip.  Smoking.  Acid backing up from the stomach into the esophagus (gastroesophageal reflux).  Certain medicines.  Chronic lung problems, including COPD (or rarely, lung cancer).  Other medical conditions such as heart failure. Follow these instructions at home: Pay attention to any changes in your symptoms. Take these actions to help with your discomfort:  Take medicines only as told by your health care provider. ? If you were prescribed an antibiotic medicine, take it as told by your health care provider. Do not stop taking the antibiotic even if you start to feel better. ? Talk with your health care provider before you take a cough suppressant medicine.  Drink enough fluid to keep your urine clear or pale yellow.  If the air is dry, use a cold steam vaporizer or humidifier in your bedroom or your home to help loosen secretions.  Avoid anything that causes you to cough at work or at home.  If your cough is worse at night, try sleeping in a  semi-upright position.  Avoid cigarette smoke. If you smoke, quit smoking. If you need help quitting, ask your health care provider.  Avoid caffeine.  Avoid alcohol.  Rest as needed. Contact a health care provider if:  You have new symptoms.  You cough up pus.  Your cough does not get better after 2-3 weeks, or your cough gets worse.  You cannot control your cough with suppressant medicines and you are losing sleep.  You develop pain that is getting worse or pain that is not controlled with pain medicines.  You have a fever.  You have unexplained weight loss.  You have night sweats. Get help right away if:  You cough up blood.  You have difficulty breathing.  Your heartbeat is very fast. This information is not intended to replace advice given to you by your health care provider. Make sure you discuss any questions you have with your health care provider. Document Released: 12/07/2010 Document Revised: 11/16/2015 Document Reviewed: 08/17/2014 Elsevier Interactive Patient Education  [employer].   If you have lab work done today you will be contacted with your lab results within the next 2 weeks.  If you have not heard from [place] then please contact us. The fastest way to get your results is to register for My Chart.   IF you received an x-ray today, you will receive an invoice from [facility]. Please contact [facility] at [phone] with questions or concerns regarding  your invoice.   IF you received labwork today, you will receive an invoice from [place]. Please contact [facility] at [phone] with questions or concerns regarding your invoice.   Our billing staff will not be able to assist you with questions regarding bills from these companies.  You will be contacted with the lab results as soon as they are available. The fastest way to get your results is to activate your My Chart account. Instructions are located on the last page of this  paperwork. If you have not heard from [place] regarding the results in 2 weeks, please contact this office.

## 2018-08-29 LAB — BASIC METABOLIC PANEL
BUN / CREAT RATIO: 12 (ref 9–20)
BUN: 10 mg/dL (ref 6–24)
CO2: 23 mmol/L (ref 20–29)
Calcium: 9.5 mg/dL (ref 8.7–10.2)
Chloride: 100 mmol/L (ref 96–106)
Creatinine, Ser: 0.84 mg/dL (ref 0.76–1.27)
GFR calc Af Amer: 114 mL/min/{1.73_m2} (ref >59)
GFR calc non Af Amer: 99 mL/min/{1.73_m2} (ref >59)
Glucose: 103 mg/dL — ABNORMAL HIGH (ref 65–99)
Potassium: 3.3 mmol/L — ABNORMAL LOW (ref 3.5–5.2)
Sodium: 144 mmol/L (ref 134–144)

## 2018-08-30 ENCOUNTER — Other Ambulatory Visit: Payer: Self-pay | Admitting: Family Medicine

## 2018-08-30 ENCOUNTER — Encounter: Payer: Self-pay | Admitting: Family Medicine

## 2018-08-30 DIAGNOSIS — E876 Hypokalemia: Secondary | ICD-10-CM

## 2018-08-30 NOTE — Progress Notes (Signed)
bmp 

## 2018-08-31 NOTE — Addendum Note (Signed)
Addended by: Shon Hale on: 08/31/2018 12:08 PM   Modules accepted: Orders

## 2019-01-25 ENCOUNTER — Other Ambulatory Visit: Payer: Self-pay

## 2019-01-25 ENCOUNTER — Encounter: Payer: Self-pay | Admitting: Family Medicine

## 2019-01-25 ENCOUNTER — Ambulatory Visit (INDEPENDENT_AMBULATORY_CARE_PROVIDER_SITE_OTHER): Payer: Managed Care, Other (non HMO) | Admitting: Family Medicine

## 2019-01-25 VITALS — BP 150/90 | HR 72 | Temp 98.6°F | Resp 16 | Wt 239.2 lb

## 2019-01-25 DIAGNOSIS — Z1329 Encounter for screening for other suspected endocrine disorder: Secondary | ICD-10-CM

## 2019-01-25 DIAGNOSIS — I10 Essential (primary) hypertension: Secondary | ICD-10-CM | POA: Diagnosis not present

## 2019-01-25 DIAGNOSIS — Z23 Encounter for immunization: Secondary | ICD-10-CM | POA: Diagnosis not present

## 2019-01-25 DIAGNOSIS — Z136 Encounter for screening for cardiovascular disorders: Secondary | ICD-10-CM

## 2019-01-25 DIAGNOSIS — E782 Mixed hyperlipidemia: Secondary | ICD-10-CM

## 2019-01-25 DIAGNOSIS — Z125 Encounter for screening for malignant neoplasm of prostate: Secondary | ICD-10-CM | POA: Diagnosis not present

## 2019-01-25 DIAGNOSIS — Z0000 Encounter for general adult medical examination without abnormal findings: Secondary | ICD-10-CM

## 2019-01-25 DIAGNOSIS — Z0001 Encounter for general adult medical examination with abnormal findings: Secondary | ICD-10-CM

## 2019-01-25 DIAGNOSIS — Z130 Encounter for screening for diseases of the blood and blood-forming organs and certain disorders involving the immune mechanism: Secondary | ICD-10-CM

## 2019-01-25 MED ORDER — SHINGRIX 50 MCG/0.5ML IM SUSR: 0.5000 mL | Freq: Once | INTRAMUSCULAR | 1 refills | Status: AC

## 2019-01-25 MED ORDER — ROSUVASTATIN CALCIUM 20 MG PO TABS: 20.0000 mg | ORAL_TABLET | Freq: Every day | ORAL | 2 refills | Status: DC

## 2019-01-25 MED ORDER — AMLODIPINE BESYLATE 10 MG PO TABS: 10.0000 mg | ORAL_TABLET | Freq: Every day | ORAL | 2 refills | Status: DC

## 2019-01-25 MED ORDER — HYDROCHLOROTHIAZIDE 25 MG PO TABS: 25.0000 mg | ORAL_TABLET | Freq: Every day | ORAL | 2 refills | Status: DC

## 2019-01-25 NOTE — Patient Instructions (Addendum)
Blood pressure high today and at home. Increase the hydrochlorothiazide for now, and recheck in next month. Return to the clinic or go to the nearest emergency room if any of your symptoms worsen or new symptoms occur. Keep a record of your blood pressures outside of the office and bring them to the next office visit.  Increase exercise slowly up to 19min per week.   Schedule appointment with dentist. Here are few options if needed.  [facility]  Address: [address], [place], [place] [address]  [email or web address]  Phone: [phone]  [facility]  Address: [address], [place], [place] [address] Phone: [phone]   Keeping you healthy  Get these tests  Blood pressure- Have your blood pressure checked once a year by your healthcare provider.  Normal blood pressure is 120/80  Weight- Have your body mass index (BMI) calculated to screen for obesity.  BMI is a measure of body fat based on height and weight. You can also calculate your own BMI at [email or web address].  Cholesterol- Have your cholesterol checked every year.  Diabetes- Have your blood sugar checked regularly if you have high blood pressure, high cholesterol, have a family history of diabetes or if you are overweight.  Screening for Colon Cancer- Colonoscopy starting at age 1.  Screening may begin sooner depending on your family history and other health conditions. Follow up colonoscopy as directed by your Gastroenterologist.  Screening for Prostate Cancer- Both blood work (PSA) and a rectal exam help screen for Prostate Cancer.  Screening begins at age 27 with African-American men and at age 20 with Caucasian men.  Screening may begin sooner depending on your family history.  Take these medicines  Aspirin- One aspirin daily can help prevent Heart disease and Stroke.  Flu shot- Every fall.  Tetanus- Every 10 years.  Zostavax- Once after the age of 75 to prevent  Shingles.  Pneumonia shot- Once after the age of 66; if you are younger than 52, ask your healthcare provider if you need a Pneumonia shot.  Take these steps  Don't smoke- If you do smoke, talk to your doctor about quitting.  For tips on how to quit, go to www.smokefree.gov or call 1-800-QUIT-NOW.  Be physically active- Exercise 5 days a week for at least 30 minutes.  If you are not already physically active start slow and gradually work up to 30 minutes of moderate physical activity.  Examples of moderate activity include walking briskly, mowing the yard, dancing, swimming, bicycling, etc.  Eat a healthy diet- Eat a variety of healthy food such as fruits, vegetables, low fat milk, low fat cheese, yogurt, lean meant, poultry, fish, beans, tofu, etc. For more information go to www.thenutritionsource.org  Drink alcohol in moderation- Limit alcohol intake to less than two drinks a day. Never drink and drive.  Dentist- Brush and floss twice daily; visit your dentist twice a year.  Depression- Your emotional health is as important as your physical health. If you're feeling down, or losing interest in things you would normally enjoy please talk to your healthcare provider.  Eye exam- Visit your eye doctor every year.  Safe sex- If you may be exposed to a sexually transmitted infection, use a condom.  Seat belts- Seat belts can save your life; always wear one.  Smoke/Carbon Monoxide detectors- These detectors need to be installed on the appropriate level of your home.  Replace batteries at least once a year.  Skin cancer- When out in the sun,  cover up and use sunscreen 15 SPF or higher.  Violence- If anyone is threatening you, please tell your healthcare provider.  Living Will/ Health care power of attorney- Speak with your healthcare provider and family.  If you have lab work done today you will be contacted with your lab results within the next 2 weeks.  If you have not heard from us then  please contact us. The fastest way to get your results is to register for My Chart.   IF you received an x-ray today, you will receive an invoice from [facility]. Please contact [facility] at [phone] with questions or concerns regarding your invoice.   IF you received labwork today, you will receive an invoice from [facility]. Please contact [facility] at [phone] with questions or concerns regarding your invoice.   Our billing staff will not be able to assist you with questions regarding bills from these companies.  You will be contacted with the lab results as soon as they are available. The fastest way to get your results is to activate your My Chart account. Instructions are located on the last page of this paperwork. If you have not heard from us regarding the results in 2 weeks, please contact this office.

## 2019-01-25 NOTE — Progress Notes (Signed)
Subjective:    Patient ID: Seth Carson, male    DOB: [birth year], 58 y.o.   MRN: [id number]  HPI Seth Carson is a 58 y.o. male Presents today for: Chief Complaint  Patient presents with  . Annual Exam    Patient is here for his annual phys today. Doing well with not other issus   Hypertension: BP Readings from Last 3 Encounters:  01/25/19 (!) 150/90  08/28/18 132/79  07/28/18 140/82   Lab Results  Component Value Date   CREATININE 0.84 08/28/2018  Amlodipine 10 mg daily, HCTZ 12.5 mg daily, on aspirin 81 mg daily.  Home readings: 152/89.  No CP/HA, or other new symptoms.    Hyperlipidemia: Lab Results  Component Value Date   CHOL 161 07/28/2018   HDL 40 07/28/2018   LDLCALC 83 07/28/2018   TRIG 191 (H) 07/28/2018   CHOLHDL 4.0 07/28/2018   Lab Results  Component Value Date   ALT 22 07/28/2018   AST 16 07/28/2018   ALKPHOS 93 07/28/2018   BILITOT 0.7 07/28/2018  Crestor 20 mg daily. No new myalgias, or other new side effects.   Obstructive sleep apnea: Central sleep apnea, complex sleep apnea followed by Dr. Rexene Alberts.  BiPAP prescribed previously. Still using Bipap. Due for follow up with sleep specialist - will be calling to reschedule appt.   Cancer screening: Colonoscopy 09/22/2017, repeat 10 years Last PSA February 2016.  Agrees to testing - father with prostate CA.   Immunization History  Administered Date(s) Administered  . Influenza,inj,Quad PF,6+ Mos 07/08/2013, 07/28/2014, 04/18/2016, 07/01/2017, 07/28/2018  . Tdap 07/08/2013  shingles: agrees.    Depression screen [facility] 2/9 01/25/2019 08/28/2018 07/28/2018 08/28/2017 07/01/2017  Decreased Interest 0 0 0 0 0  Down, Depressed, Hopeless 0 0 0 0 0  PHQ - 2 Score 0 0 0 0 0    Hearing Screening   125Hz  250Hz  500Hz  1000Hz  2000Hz  3000Hz  4000Hz  6000Hz  8000Hz   Right ear:           Left ear:             Visual Acuity Screening   Right eye Left eye Both eyes  Without correction:     With correction: 20/20  20/15 20/15  optho : [facility], Dr. Arlina Robes. 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   Dental: no recent eval. Did not like prior dentist.   Exercise: decreased recently. Trying to get back to walking, biking.   Wt Readings from Last 3 Encounters:  01/25/19 239 lb 3.2 oz (108.5 kg)  08/28/18 231 lb 12.8 oz (105.1 kg)  07/28/18 234 lb 3.2 oz (106.2 kg)     Patient Active Problem List   Diagnosis Date Noted  . Unspecified sleep apnea 08/17/2013  . HTN (hypertension) 06/10/2013  . Hyperlipidemia 06/10/2013   Past Medical History:  Diagnosis Date  . Hyperlipidemia   . Hypertension   . Sleep apnea    uses BI-PAP    Past Surgical History:  Procedure Laterality Date  . DENTAL EXAMINATION UNDER ANESTHESIA  at age 81   No Known Allergies Prior to Admission medications   Medication Sig Start Date End Date Taking? Authorizing Provider  amLODipine (NORVASC) 10 MG tablet Take 1 tablet (10 mg total) by mouth daily. 07/28/18   Wendie Agreste, MD  aspirin EC 81 MG tablet Take 81 mg by mouth daily.    [provider]  benzonatate (TESSALON) 100 MG capsule Take 1 capsule (100 mg  total) by mouth 3 (three) times daily as needed for cough. 08/28/18   Shade FloodGreene, Jaystin Mcgarvey R, MD  hydrochlorothiazide (MICROZIDE) 12.5 MG capsule Take 1 capsule (12.5 mg total) by mouth daily. 07/28/18   Shade FloodGreene, Mikailah Morel R, MD  rosuvastatin (CRESTOR) 20 MG tablet Take 1 tablet (20 mg total) by mouth daily. 07/28/18   Shade FloodGreene, Ladiamond Gallina R, MD   Social History   Socioeconomic History  . Marital status: Single    Spouse name: n/a  . Number of children: 0  . Years of education: 5812  . Highest education level: Not on file  Occupational History  . Occupation: Systems developercopier technician  Social Needs  . Financial resource strain: Not on file  . Food insecurity    Worry: Not on file    Inability: Not on file  . Transportation needs    Medical: Not on file    Non-medical: Not on file  Tobacco Use  .  Smoking status: Never Smoker  . Smokeless tobacco: Never Used  Substance and Sexual Activity  . Alcohol use: Yes    Comment: rarely  . Drug use: No  . Sexual activity: Not on file  Lifestyle  . Physical activity    Days per week: Not on file    Minutes per session: Not on file  . Stress: Not on file  Relationships  . Social Musicianconnections    Talks on phone: Not on file    Gets together: Not on file    Attends religious service: Not on file    Active member of club or organization: Not on file    Attends meetings of clubs or organizations: Not on file    Relationship status: Not on file  . Intimate partner violence    Fear of current or ex partner: Not on file    Emotionally abused: Not on file    Physically abused: Not on file    Forced sexual activity: Not on file  Other Topics Concern  . Not on file  Social History Narrative   Marital status: single;       Children:  None       Lives with his mother "she won't let me leave."      Employment:  [employer] x 21 years; repairman; happy      Tobacco: none      Alcohol:  none      Exercise:  Sporadic.  Some walking.    Review of Systems 13 point review of systems per patient health survey noted.  Negative other than as indicated above or in HPI.      Objective:   Physical Exam Vitals signs reviewed.  Constitutional:      Appearance: He is well-developed.  HENT:     Head: Normocephalic and atraumatic.     Right Ear: External ear normal.     Left Ear: External ear normal.  Eyes:     Conjunctiva/sclera: Conjunctivae normal.     Pupils: Pupils are equal, round, and reactive to light.  Neck:     Musculoskeletal: Normal range of motion and neck supple.     Thyroid: No thyromegaly.  Cardiovascular:     Rate and Rhythm: Normal rate and regular rhythm.     Heart sounds: Normal heart sounds.  Pulmonary:     Effort: Pulmonary effort is normal. No respiratory distress.     Breath sounds: Normal breath sounds. No  wheezing.  Abdominal:     General: There is no distension.  Palpations: Abdomen is soft.     Tenderness: There is no abdominal tenderness.     Hernia: There is no hernia in the left inguinal area.  Genitourinary:    Prostate: Normal.  Musculoskeletal: Normal range of motion.        General: No tenderness.  Lymphadenopathy:     Cervical: No cervical adenopathy.  Skin:    General: Skin is warm and dry.  Neurological:     Mental Status: He is alert and oriented to person, place, and time.     Deep Tendon Reflexes: Reflexes are normal and symmetric.  Psychiatric:        Behavior: Behavior normal.    Vitals:   01/25/19 0849 01/25/19 0852  BP: (!) 160/87 (!) 150/90  Pulse: 72   Resp: 16   Temp: 98.6 F (37 C)   TempSrc: Oral   SpO2: 96%   Weight: 239 lb 3.2 oz (108.5 kg)     EKG, sinus bradycardia rate 52.  Findings for suspected LVH without apparent acute findings.    Assessment & Plan:   Seth Carson is a 58 y.o. male Annual physical exam  - -anticipatory guidance as below in AVS, screening labs above. Health maintenance items as above in HPI discussed/recommended as applicable.   Need for shingles vaccine - Plan: Zoster Vaccine Adjuvanted [facility](SHINGRIX) injection sent to pharmacy.   Screening for prostate cancer - Plan: PSA  - We discussed pros and cons of prostate cancer screening, and after this discussion, he chose to have screening done. PSA obtained, and no concerning findings on DRE. FH of CA in father in 1260's.   Screening for thyroid disorder - Plan: TSH  Screening, anemia, deficiency, iron - Plan: CBC  Essential hypertension - Plan: Comprehensive metabolic panel, amLODipine (NORVASC) 10 MG tablet, hydrochlorothiazide (HYDRODIURIL) 25 MG tablet, EKG 12-Lead  - decreased control. Low intensity exercise for wt loss. Increase HCTZ for now, recheck 1 month.   Mixed hyperlipidemia - Plan: Comprehensive metabolic panel, Lipid panel, rosuvastatin (CRESTOR) 20 MG  tablet, EKG 12-Lead  -  Stable, tolerating current regimen. Medications refilled. Labs pending as above.   Screening for cardiovascular condition - Plan: EKG 12-Lead  -Does not appear to have acute findings, suspected LVH with similar appearance previously.  Nonspecific ST depression in lead I may also be due to LVH.  Asymptomatic.  Meds ordered this encounter  Medications  . Zoster Vaccine Adjuvanted [facility](SHINGRIX) injection    Sig: Inject 0.5 mLs into the muscle once for 1 dose. Repeat in 2-6 months.    Dispense:  0.5 mL    Refill:  1  . amLODipine (NORVASC) 10 MG tablet    Sig: Take 1 tablet (10 mg total) by mouth daily.    Dispense:  90 tablet    Refill:  2  . hydrochlorothiazide (HYDRODIURIL) 25 MG tablet    Sig: Take 1 tablet (25 mg total) by mouth daily.    Dispense:  90 tablet    Refill:  2  . rosuvastatin (CRESTOR) 20 MG tablet    Sig: Take 1 tablet (20 mg total) by mouth daily.    Dispense:  90 tablet    Refill:  2   Patient Instructions   Blood pressure high today and at home. Increase the hydrochlorothiazide for now, and recheck in next month. Return to the clinic or go to the nearest emergency room if any of your symptoms worsen or new symptoms occur. Keep a record of your blood pressures outside  of the office and bring them to the next office visit.  Increase exercise slowly up to per week.   Schedule appointment with dentist. Here are few options if needed.  [facility]  Address: [address] [place], [place] [address]  [email or web address]  Phone: [phone]  [facility]  Address: [address], [place], [place] [address] Phone: [phone]   Keeping you healthy  Get these tests  Blood pressure- Have your blood pressure checked once a year by your healthcare provider.  Normal blood pressure is 120/80  Weight- Have your body mass index (BMI) calculated to screen for obesity.  BMI is a measure of body fat based  on height and weight. You can also calculate your own BMI at [email or web address].  Cholesterol- Have your cholesterol checked every year.  Diabetes- Have your blood sugar checked regularly if you have high blood pressure, high cholesterol, have a family history of diabetes or if you are overweight.  Screening for Colon Cancer- Colonoscopy starting at age 41.  Screening may begin sooner depending on your family history and other health conditions. Follow up colonoscopy as directed by your Gastroenterologist.  Screening for Prostate Cancer- Both blood work (PSA) and a rectal exam help screen for Prostate Cancer.  Screening begins at age 5 with African-American men and at age 11 with Caucasian men.  Screening may begin sooner depending on your family history.  Take these medicines  Aspirin- One aspirin daily can help prevent Heart disease and Stroke.  Flu shot- Every fall.  Tetanus- Every 10 years.  Zostavax- Once after the age of 42 to prevent Shingles.  Pneumonia shot- Once after the age of 17; if you are younger than 51, ask your healthcare provider if you need a Pneumonia shot.  Take these steps  Don't smoke- If you do smoke, talk to your doctor about quitting.  For tips on how to quit, go to www.smokefree.gov or call 1-800-QUIT-NOW.  Be physically active- Exercise 5 days a week for at least 30 minutes.  If you are not already physically active start slow and gradually work up to 30 minutes of moderate physical activity.  Examples of moderate activity include walking briskly, mowing the yard, dancing, swimming, bicycling, etc.  Eat a healthy diet- Eat a variety of healthy food such as fruits, vegetables, low fat milk, low fat cheese, yogurt, lean meant, poultry, fish, beans, tofu, etc. For more information go to www.thenutritionsource.org  Drink alcohol in moderation- Limit alcohol intake to less than two drinks a day. Never drink and drive.  Dentist- Brush and floss twice  daily; visit your dentist twice a year.  Depression- Your emotional health is as important as your physical health. If you're feeling down, or losing interest in things you would normally enjoy please talk to your healthcare provider.  Eye exam- Visit your eye doctor every year.  Safe sex- If you may be exposed to a sexually transmitted infection, use a condom.  Seat belts- Seat belts can save your life; always wear one.  Smoke/Carbon Monoxide detectors- These detectors need to be installed on the appropriate level of your home.  Replace batteries at least once a year.  Skin cancer- When out in the sun, cover up and use sunscreen 15 SPF or higher.  Violence- If anyone is threatening you, please tell your healthcare provider.  Living Will/ Health care power of attorney- Speak with your healthcare provider and family.  If you have lab work done today you  will be contacted with your lab results within the next 2 weeks.  If you have not heard from us then please contact us. The fastest way to get your results is to register for My Chart.   IF you received an x-ray today, you will receive an invoice from [facility]. Please contact [facility] at [phone] with questions or concerns regarding your invoice.   IF you received labwork today, you will receive an invoice from [facility]. Please contact [facility] at [phone] with questions or concerns regarding your invoice.   Our billing staff will not be able to assist you with questions regarding bills from these companies.  You will be contacted with the lab results as soon as they are available. The fastest way to get your results is to activate your My Chart account. Instructions are located on the last page of this paperwork. If you have not heard from us regarding the results in 2 weeks, please contact this office.       Signed,   Meredith StaggersJeffrey Leeann Bady, MD [facility] at [facility].  01/25/19  9:33 AM

## 2019-01-26 LAB — COMPREHENSIVE METABOLIC PANEL
ALT: 25 IU/L (ref 0–44)
AST: 23 IU/L (ref 0–40)
Albumin/Globulin Ratio: 1.7 (ref 1.2–2.2)
Albumin: 4.7 g/dL (ref 3.8–4.9)
Alkaline Phosphatase: 72 IU/L (ref 39–117)
BUN/Creatinine Ratio: 9 (ref 9–20)
BUN: 10 mg/dL (ref 6–24)
Bilirubin Total: 0.6 mg/dL (ref 0.0–1.2)
CO2: 26 mmol/L (ref 20–29)
Calcium: 9.9 mg/dL (ref 8.7–10.2)
Chloride: 101 mmol/L (ref 96–106)
Creatinine, Ser: 1.06 mg/dL (ref 0.76–1.27)
GFR calc Af Amer: 90 mL/min/{1.73_m2} (ref >59)
GFR calc non Af Amer: 78 mL/min/{1.73_m2} (ref >59)
Globulin, Total: 2.8 g/dL (ref 1.5–4.5)
Glucose: 109 mg/dL — ABNORMAL HIGH (ref 65–99)
Potassium: 3.1 mmol/L — ABNORMAL LOW (ref 3.5–5.2)
Sodium: 144 mmol/L (ref 134–144)
Total Protein: 7.5 g/dL (ref 6.0–8.5)

## 2019-01-26 LAB — LIPID PANEL
Chol/HDL Ratio: 4 ratio (ref 0.0–5.0)
Cholesterol, Total: 170 mg/dL (ref 100–199)
HDL: 42 mg/dL (ref >39)
LDL Calculated: 98 mg/dL (ref 0–99)
Triglycerides: 149 mg/dL (ref 0–149)
VLDL Cholesterol Cal: 30 mg/dL (ref 5–40)

## 2019-01-26 LAB — CBC
Hematocrit: 45.6 % (ref 37.5–51.0)
Hemoglobin: 15.9 g/dL (ref 13.0–17.7)
MCH: 27.7 pg (ref 26.6–33.0)
MCHC: 34.9 g/dL (ref 31.5–35.7)
MCV: 80 fL (ref 79–97)
Platelets: 242 10*3/uL (ref 150–450)
RBC: 5.73 x10E6/uL (ref 4.14–5.80)
RDW: 12.4 % (ref 11.6–15.4)
WBC: 6 10*3/uL (ref 3.4–10.8)

## 2019-01-26 LAB — TSH: TSH: 2.4 u[IU]/mL (ref 0.450–4.500)

## 2019-01-26 LAB — PSA: Prostate Specific Ag, Serum: 1.2 ng/mL (ref 0.0–4.0)

## 2019-02-02 ENCOUNTER — Other Ambulatory Visit: Payer: Self-pay | Admitting: Family Medicine

## 2019-02-02 DIAGNOSIS — E876 Hypokalemia: Secondary | ICD-10-CM

## 2019-02-02 MED ORDER — POTASSIUM CHLORIDE CRYS ER 20 MEQ PO TBCR: 20.0000 meq | EXTENDED_RELEASE_TABLET | Freq: Every day | ORAL | 2 refills | Status: DC

## 2019-02-02 NOTE — Progress Notes (Signed)
See lab note.  

## 2019-11-01 ENCOUNTER — Other Ambulatory Visit: Payer: Self-pay | Admitting: Family Medicine

## 2019-11-01 DIAGNOSIS — I10 Essential (primary) hypertension: Secondary | ICD-10-CM

## 2019-11-01 DIAGNOSIS — E782 Mixed hyperlipidemia: Secondary | ICD-10-CM

## 2020-01-27 ENCOUNTER — Ambulatory Visit (INDEPENDENT_AMBULATORY_CARE_PROVIDER_SITE_OTHER): Payer: No Typology Code available for payment source | Admitting: Family Medicine

## 2020-01-27 ENCOUNTER — Encounter: Payer: Self-pay | Admitting: Family Medicine

## 2020-01-27 ENCOUNTER — Other Ambulatory Visit: Payer: Self-pay

## 2020-01-27 VITALS — BP 149/88 | HR 76 | Temp 98.2°F | Resp 15 | Ht 70.0 in | Wt 238.2 lb

## 2020-01-27 DIAGNOSIS — Z125 Encounter for screening for malignant neoplasm of prostate: Secondary | ICD-10-CM

## 2020-01-27 DIAGNOSIS — Z0001 Encounter for general adult medical examination with abnormal findings: Secondary | ICD-10-CM

## 2020-01-27 DIAGNOSIS — G4733 Obstructive sleep apnea (adult) (pediatric): Secondary | ICD-10-CM | POA: Diagnosis not present

## 2020-01-27 DIAGNOSIS — E876 Hypokalemia: Secondary | ICD-10-CM

## 2020-01-27 DIAGNOSIS — L989 Disorder of the skin and subcutaneous tissue, unspecified: Secondary | ICD-10-CM

## 2020-01-27 DIAGNOSIS — E782 Mixed hyperlipidemia: Secondary | ICD-10-CM

## 2020-01-27 DIAGNOSIS — I10 Essential (primary) hypertension: Secondary | ICD-10-CM | POA: Diagnosis not present

## 2020-01-27 DIAGNOSIS — Z0000 Encounter for general adult medical examination without abnormal findings: Secondary | ICD-10-CM

## 2020-01-27 MED ORDER — ROSUVASTATIN CALCIUM 20 MG PO TABS: 20.0000 mg | ORAL_TABLET | Freq: Every day | ORAL | 2 refills | Status: DC

## 2020-01-27 MED ORDER — POTASSIUM CHLORIDE CRYS ER 20 MEQ PO TBCR: 20.0000 meq | EXTENDED_RELEASE_TABLET | Freq: Every day | ORAL | 2 refills | Status: DC

## 2020-01-27 MED ORDER — AMLODIPINE BESYLATE 10 MG PO TABS: 10.0000 mg | ORAL_TABLET | Freq: Every day | ORAL | 2 refills | Status: DC

## 2020-01-27 MED ORDER — LISINOPRIL 10 MG PO TABS: 10.0000 mg | ORAL_TABLET | Freq: Every day | ORAL | 1 refills | Status: DC

## 2020-01-27 MED ORDER — HYDROCHLOROTHIAZIDE 25 MG PO TABS: 25.0000 mg | ORAL_TABLET | Freq: Every day | ORAL | 2 refills | Status: DC

## 2020-01-27 NOTE — Progress Notes (Signed)
Subjective:  Patient ID: Seth Carson, male    DOB: August 24, 1960  Age: 59 y.o. MRN: [id number]  CC:  Chief Complaint  Patient presents with  . Annual Exam    pt is feeling well todayno concerns outside of HTN, pt needs refill on Crestor for cholesterol labs drawn today   . Hypertension    pt denies physical symptoms, has taken his BP a few times since last visit at home BP about 136-150 / 90, pt does need refill amlodapine, and HCTZ    HPI Seth Carson presents for  Annual physical exam.  Hypertension: Takes amlodipine 10 mg daily, hydrochlorothiazide 25 mg daily, potassium 20 mEq daily.  1 month recheck recommended after increasing hydrochlorothiazide in August 2020.  No visit since that time. Complicated by history of obstructive sleep apnea, central sleep apnea, complex sleep apnea previously treated by Dr. Frances Furbish.  BiPAP prescribed - using Bipap nightly. Home readings: occasional - 136/84, some 140-150 at times. Elevating readings few months ago.  No missed doses of meds or new side effects recently. Lisinopril in past - off when improved control.  Some increased restaurant food lately.  Ran out of potassium few months ago. Eating bananas regularly.   BP Readings from Last 3 Encounters:  01/27/20 (!) 149/88  01/25/19 (!) 150/90  08/28/18 132/79   Lab Results  Component Value Date   CREATININE 1.06 01/25/2019   Hyperlipidemia: Crestor 20 mg daily Lab Results  Component Value Date   CHOL 170 01/25/2019   HDL 42 01/25/2019   LDLCALC 98 01/25/2019   TRIG 149 01/25/2019   CHOLHDL 4.0 01/25/2019   Lab Results  Component Value Date   ALT 25 01/25/2019   AST 23 01/25/2019   ALKPHOS 72 01/25/2019   BILITOT 0.6 01/25/2019   Cancer screening: Colonoscopy 09/22/2017, repeat 10 years Prostate: after r/b of testing discussed - declined DRE at this time. FH of prostate CA in father - PSA requested. limitations in testing without DRE discussed.  Lab Results  Component Value  Date   PSA1 1.2 01/25/2019   PSA 1.26 07/28/2014   PSA 1.17 07/08/2013   Immunization History  Administered Date(s) Administered  . Influenza,inj,Quad PF,6+ Mos 07/08/2013, 07/28/2014, 04/18/2016, 07/01/2017, 07/28/2018  . PFIZER SARS-COV-2 Vaccination 09/15/2019, 10/06/2019  . Tdap 07/08/2013   Depression screen [facility] 2/9 01/27/2020 01/25/2019 08/28/2018 07/28/2018 08/28/2017  Decreased Interest 0 0 0 0 0  Down, Depressed, Hopeless 0 0 0 0 0  PHQ - 2 Score 0 0 0 0 0     Hearing Screening   125Hz  250Hz  500Hz  1000Hz  2000Hz  3000Hz  4000Hz  6000Hz  8000Hz   Right ear:           Left ear:             Visual Acuity Screening   Right eye Left eye Both eyes  Without correction:     With correction: 20/30 20/25 20/20    optho - appt in 2 weeks. 6 month appt with glaucoma - on gttts.   Dental: no recent appt - plans to schedule.   Exercise: some exercise - waking at lunch.   Skin tags below eye growing some past year, end comes off at times, hard skin.  New area of skin projection on chin noted in past year as well. Has not seen derm.    History Patient Active Problem List   Diagnosis Date Noted  . Unspecified sleep apnea 08/17/2013  . HTN (hypertension) 06/10/2013  . Hyperlipidemia 06/10/2013   Past  Medical History:  Diagnosis Date  . Hyperlipidemia   . Hypertension   . Sleep apnea    uses BI-PAP    Past Surgical History:  Procedure Laterality Date  . DENTAL EXAMINATION UNDER ANESTHESIA  at age 24   No Known Allergies Prior to Admission medications   Medication Sig Start Date End Date Taking? Authorizing Provider  amLODipine (NORVASC) 10 MG tablet Take 1 tablet by mouth once daily 11/01/19  Yes [name], MD  aspirin EC 81 MG tablet Take 81 mg by mouth daily.   Yes [provider]  hydrochlorothiazide (HYDRODIURIL) 25 MG tablet Take 1 tablet by mouth once daily 11/01/19  Yes [name], MD  rosuvastatin (CRESTOR) 20 MG tablet Take 1 tablet by mouth once daily  11/01/19  Yes [name], MD  benzonatate (TESSALON) 100 MG capsule Take 1 capsule (100 mg total) by mouth 3 (three) times daily as needed for cough. Patient not taking: Reported on 01/27/2020 08/28/18   [name], MD  potassium chloride SA (K-DUR) 20 MEQ tablet Take 1 tablet (20 mEq total) by mouth daily. Take 2 tabs once initially, then 1 po qd. Patient not taking: Reported on 01/27/2020 02/02/19   [name], MD   Social History   Socioeconomic History  . Marital status: Single    Spouse name: n/a  . Number of children: 0  . Years of education: 13  . Highest education level: Not on file  Occupational History  . Occupation: Systems developer  Tobacco Use  . Smoking status: Never Smoker  . Smokeless tobacco: Never Used  Vaping Use  . Vaping Use: Never used  Substance and Sexual Activity  . Alcohol use: Not Currently  . Drug use: No  . Sexual activity: Not on file  Other Topics Concern  . Not on file  Social History Narrative   Marital status: single;       Children:  None       Lives with his mother "she won't let me leave."      Employment:  [employer] x 21 years; repairman; happy      Tobacco: none      Alcohol:  none      Exercise:  Sporadic.  Some walking.   Social Determinants of Health   Financial Resource Strain:   . Difficulty of Paying Living Expenses:   Food Insecurity:   . Worried About Programme researcher, broadcasting/film/video in the Last Year:   . Barista in the Last Year:   Transportation Needs:   . Freight forwarder (Medical):   Marland Kitchen Lack of Transportation (Non-Medical):   Physical Activity:   . Days of Exercise per Week:   . Minutes of Exercise per Session:   Stress:   . Feeling of Stress :   Social Connections:   . Frequency of Communication with Friends and Family:   . Frequency of Social Gatherings with Friends and Family:   . Attends Religious Services:   . Active Member of Clubs or Organizations:   . Attends Banker  Meetings:   Marland Kitchen Marital Status:   Intimate Partner Violence:   . Fear of Current or Ex-Partner:   . Emotionally Abused:   Marland Kitchen Physically Abused:   . Sexually Abused:     Review of Systems 13 point review of systems per patient health survey noted.  Negative other than as indicated above or in HPI.    Objective:  Vitals:   01/27/20 0903  BP: (!) 149/88  Pulse: 76  Resp: 15  Temp: 98.2 F (36.8 C)  TempSrc: Temporal  SpO2: 97%  Weight: 238 lb 3.2 oz (108 kg)  Height: 5\' 10"  (1.778 m)     Physical Exam Vitals reviewed.  Constitutional:      Appearance: He is well-developed.  HENT:     Head: Normocephalic and atraumatic.      Right Ear: External ear normal.     Left Ear: External ear normal.  Eyes:     Conjunctiva/sclera: Conjunctivae normal.     Pupils: Pupils are equal, round, and reactive to light.  Neck:     Thyroid: No thyromegaly.  Cardiovascular:     Rate and Rhythm: Normal rate and regular rhythm.     Heart sounds: Normal heart sounds.  Pulmonary:     Effort: Pulmonary effort is normal. No respiratory distress.     Breath sounds: Normal breath sounds. No wheezing.  Abdominal:     General: There is no distension.     Palpations: Abdomen is soft.     Tenderness: There is no abdominal tenderness.  Musculoskeletal:        General: No tenderness. Normal range of motion.     Cervical back: Normal range of motion and neck supple.  Lymphadenopathy:     Cervical: No cervical adenopathy.  Skin:    General: Skin is warm and dry.  Neurological:     Mental Status: He is alert and oriented to person, place, and time.     Deep Tendon Reflexes: Reflexes are normal and symmetric.  Psychiatric:        Behavior: Behavior normal.      Assessment & Plan:  Seth Carson is a 59 y.o. male . Annual physical exam - Plan: CANCELED: CBC, CANCELED: Lipid panel, CANCELED: Comprehensive metabolic panel, CANCELED: Hemoglobin A1c  - -anticipatory guidance as below in AVS,  screening labs above. Health maintenance items as above in HPI discussed/recommended as applicable.   Essential hypertension - Plan: amLODipine (NORVASC) 10 MG tablet, hydrochlorothiazide (HYDRODIURIL) 25 MG tablet, lisinopril (ZESTRIL) 10 MG tablet  -Decreased control.  Diet adjustment with decreased sodium discussed, then restart lisinopril if persistent elevation.  Recheck 6 weeks, labs pending.  Mixed hyperlipidemia - Plan: rosuvastatin (CRESTOR) 20 MG tablet  Stable, tolerating current regimen. Medications refilled. Labs pending as above.   Hypokalemia - Plan: potassium chloride SA (KLOR-CON) 20 MEQ tablet  -Check labs, restart potassium.  OSA treated with BiPAP  -Compliant, continue BiPAP  Facial skin lesion - Plan: Ambulatory referral to Dermatology  -Skin tag slightly but with some projectile lesions and thickening of lesions differential includes cutaneous horn, possible vertical lesion at chin.  Refer to dermatology.  Screening for prostate cancer - Plan: PSA  - We discussed pros and cons of prostate cancer screening, and after this discussion, he chose to have screening done with PSA only and limitations with  testing discussed.   Meds ordered this encounter  Medications  . amLODipine (NORVASC) 10 MG tablet    Sig: Take 1 tablet (10 mg total) by mouth daily.    Dispense:  90 tablet    Refill:  2  . hydrochlorothiazide (HYDRODIURIL) 25 MG tablet    Sig: Take 1 tablet (25 mg total) by mouth daily.    Dispense:  90 tablet    Refill:  2  . rosuvastatin (CRESTOR) 20 MG tablet    Sig: Take 1 tablet (20 mg total)  by mouth daily.    Dispense:  90 tablet    Refill:  2  . lisinopril (ZESTRIL) 10 MG tablet    Sig: Take 1 tablet (10 mg total) by mouth daily.    Dispense:  90 tablet    Refill:  1  . potassium chloride SA (KLOR-CON) 20 MEQ tablet    Sig: Take 1 tablet (20 mEq total) by mouth daily. T    Dispense:  30 tablet    Refill:  2   Patient Instructions    See  handout on foods to avoid for sodium, try to prepare more food at home, but cut back on high sodium foods.  If blood pressures over 140 - start new printed prescription. Recheck in 6 weeks. Will need repeat blood work at that time if you do start new med.     If you have lab work done today you will be contacted with your lab results within the next 2 weeks.  If you have not heard from [place] then please contact us. The fastest way to get your results is to register for My Chart.   IF you received an x-ray today, you will receive an invoice from [facility]. Please contact [facility] at [phone] with questions or concerns regarding your invoice.   IF you received labwork today, you will receive an invoice from [place]. Please contact [facility] at [phone] with questions or concerns regarding your invoice.   Our billing staff will not be able to assist you with questions regarding bills from these companies.  You will be contacted with the lab results as soon as they are available. The fastest way to get your results is to activate your My Chart account. Instructions are located on the last page of this paperwork. If you have not heard from [place] regarding the results in 2 weeks, please contact this office.         Signed, Meredith Staggers, MD [facility]

## 2020-01-27 NOTE — Patient Instructions (Addendum)
  See handout on foods to avoid for sodium, try to prepare more food at home, but cut back on high sodium foods.  If blood pressures over 140 - start new printed prescription. Recheck in 6 weeks. Will need repeat blood work at that time if you do start new med.     If you have lab work done today you will be contacted with your lab results within the next 2 weeks.  If you have not heard from [place] then please contact us. The fastest way to get your results is to register for My Chart.   IF you received an x-ray today, you will receive an invoice from [facility]. Please contact [facility] Radiology at [phone] with questions or concerns regarding your invoice.   IF you received labwork today, you will receive an invoice from [place]. Please contact [facility] at [phone] with questions or concerns regarding your invoice.   Our billing staff will not be able to assist you with questions regarding bills from these companies.  You will be contacted with the lab results as soon as they are available. The fastest way to get your results is to activate your My Chart account. Instructions are located on the last page of this paperwork. If you have not heard from [place] regarding the results in 2 weeks, please contact this office.

## 2020-01-28 LAB — CMP14+EGFR
ALT: 28 IU/L (ref 0–44)
AST: 24 IU/L (ref 0–40)
Albumin/Globulin Ratio: 1.5 (ref 1.2–2.2)
Albumin: 4.7 g/dL (ref 3.8–4.9)
Alkaline Phosphatase: 64 IU/L (ref 48–121)
BUN/Creatinine Ratio: 11 (ref 9–20)
BUN: 12 mg/dL (ref 6–24)
Bilirubin Total: 0.9 mg/dL (ref 0.0–1.2)
CO2: 26 mmol/L (ref 20–29)
Calcium: 9.7 mg/dL (ref 8.7–10.2)
Chloride: 96 mmol/L (ref 96–106)
Creatinine, Ser: 1.08 mg/dL (ref 0.76–1.27)
GFR calc Af Amer: 88 mL/min/{1.73_m2} (ref >59)
GFR calc non Af Amer: 76 mL/min/{1.73_m2} (ref >59)
Globulin, Total: 3.1 g/dL (ref 1.5–4.5)
Glucose: 105 mg/dL — ABNORMAL HIGH (ref 65–99)
Potassium: 2.9 mmol/L — ABNORMAL LOW (ref 3.5–5.2)
Sodium: 139 mmol/L (ref 134–144)
Total Protein: 7.8 g/dL (ref 6.0–8.5)

## 2020-01-28 LAB — LIPID PANEL
Chol/HDL Ratio: 3.9 ratio (ref 0.0–5.0)
Cholesterol, Total: 160 mg/dL (ref 100–199)
HDL: 41 mg/dL (ref >39)
LDL Chol Calc (NIH): 85 mg/dL (ref 0–99)
Triglycerides: 202 mg/dL — ABNORMAL HIGH (ref 0–149)
VLDL Cholesterol Cal: 34 mg/dL (ref 5–40)

## 2020-01-28 LAB — CBC
Hematocrit: 45.3 % (ref 37.5–51.0)
Hemoglobin: 15.5 g/dL (ref 13.0–17.7)
MCH: 27.4 pg (ref 26.6–33.0)
MCHC: 34.2 g/dL (ref 31.5–35.7)
MCV: 80 fL (ref 79–97)
Platelets: 262 10*3/uL (ref 150–450)
RBC: 5.66 x10E6/uL (ref 4.14–5.80)
RDW: 13.2 % (ref 11.6–15.4)
WBC: 6 10*3/uL (ref 3.4–10.8)

## 2020-01-28 LAB — HEMOGLOBIN A1C
Est. average glucose Bld gHb Est-mCnc: 128 mg/dL
Hgb A1c MFr Bld: 6.1 % — ABNORMAL HIGH (ref 4.8–5.6)

## 2020-01-28 LAB — PSA: Prostate Specific Ag, Serum: 1.6 ng/mL (ref 0.0–4.0)

## 2020-02-06 ENCOUNTER — Other Ambulatory Visit: Payer: Self-pay | Admitting: Family Medicine

## 2020-02-06 DIAGNOSIS — E876 Hypokalemia: Secondary | ICD-10-CM

## 2020-02-06 NOTE — Progress Notes (Signed)
See lab notes

## 2020-03-10 ENCOUNTER — Encounter: Payer: Self-pay | Admitting: Family Medicine

## 2020-03-10 ENCOUNTER — Other Ambulatory Visit: Payer: Self-pay

## 2020-03-10 ENCOUNTER — Ambulatory Visit: Payer: No Typology Code available for payment source | Admitting: Family Medicine

## 2020-03-10 VITALS — BP 131/79 | HR 77 | Temp 98.1°F | Ht 70.0 in | Wt 238.0 lb

## 2020-03-10 DIAGNOSIS — I10 Essential (primary) hypertension: Secondary | ICD-10-CM | POA: Diagnosis not present

## 2020-03-10 DIAGNOSIS — E876 Hypokalemia: Secondary | ICD-10-CM

## 2020-03-10 DIAGNOSIS — Z23 Encounter for immunization: Secondary | ICD-10-CM | POA: Diagnosis not present

## 2020-03-10 LAB — BASIC METABOLIC PANEL
BUN/Creatinine Ratio: 13 (ref 9–20)
BUN: 13 mg/dL (ref 6–24)
CO2: 26 mmol/L (ref 20–29)
Calcium: 9.8 mg/dL (ref 8.7–10.2)
Chloride: 100 mmol/L (ref 96–106)
Creatinine, Ser: 1.04 mg/dL (ref 0.76–1.27)
GFR calc Af Amer: 92 mL/min/{1.73_m2} (ref >59)
GFR calc non Af Amer: 79 mL/min/{1.73_m2} (ref >59)
Glucose: 95 mg/dL (ref 65–99)
Potassium: 3.3 mmol/L — ABNORMAL LOW (ref 3.5–5.2)
Sodium: 140 mmol/L (ref 134–144)

## 2020-03-10 NOTE — Patient Instructions (Addendum)
No med changes for now.   If blood pressure is much better, you have the option of stopping lisinopril again with close monitoring.  See information below on management of high blood pressure.  Dependent on blood work today, can adjust your potassium dose if needed but stay on that once per day for now.  Thank you for coming in today, let me know if there are questions   Managing Your Hypertension Hypertension is commonly called high blood pressure. This is when the force of your blood pressing against the walls of your arteries is too strong. Arteries are blood vessels that carry blood from your heart throughout your body. Hypertension forces the heart to work harder to pump blood, and may cause the arteries to become narrow or stiff. Having untreated or uncontrolled hypertension can cause heart attack, stroke, kidney disease, and other problems. What are blood pressure readings? A blood pressure reading consists of a higher number over a lower number. Ideally, your blood pressure should be below 120/80. The first ("top") number is called the systolic pressure. It is a measure of the pressure in your arteries as your heart beats. The second ("bottom") number is called the diastolic pressure. It is a measure of the pressure in your arteries as the heart relaxes. What does my blood pressure reading mean? Blood pressure is classified into four stages. Based on your blood pressure reading, your health care provider may use the following stages to determine what type of treatment you need, if any. Systolic pressure and diastolic pressure are measured in a unit called mm Hg. Normal  Systolic pressure: below 120.  Diastolic pressure: below 80. Elevated  Systolic pressure: 120-129.  Diastolic pressure: below 80. Hypertension stage 1  Systolic pressure: 130-139.  Diastolic pressure: 80-89. Hypertension stage 2  Systolic pressure: 140 or above.  Diastolic pressure: 90 or above. What health  risks are associated with hypertension? Managing your hypertension is an important responsibility. Uncontrolled hypertension can lead to:  A heart attack.  A stroke.  A weakened blood vessel (aneurysm).  Heart failure.  Kidney damage.  Eye damage.  Metabolic syndrome.  Memory and concentration problems. What changes can I make to manage my hypertension? Hypertension can be managed by making lifestyle changes and possibly by taking medicines. Your health care provider will help you make a plan to bring your blood pressure within a normal range. Eating and drinking   Eat a diet that is high in fiber and potassium, and low in salt (sodium), added sugar, and fat. An example eating plan is called the DASH (Dietary Approaches to Stop Hypertension) diet. To eat this way: ? Eat plenty of fresh fruits and vegetables. Try to fill half of your plate at each meal with fruits and vegetables. ? Eat whole grains, such as whole wheat pasta, brown rice, or whole grain bread. Fill about one quarter of your plate with whole grains. ? Eat low-fat diary products. ? Avoid fatty cuts of meat, processed or cured meats, and poultry with skin. Fill about one quarter of your plate with lean proteins such as fish, chicken without skin, beans, eggs, and tofu. ? Avoid premade and processed foods. These tend to be higher in sodium, added sugar, and fat.  Reduce your daily sodium intake. Most people with hypertension should eat less than 1,500 mg of sodium a day.  Limit alcohol intake to no more than 1 drink a day for nonpregnant women and 2 drinks a day for men. One drink equals  12 oz of beer, 5 oz of wine, or 1 oz of hard liquor. Lifestyle  Work with your health care provider to maintain a healthy body weight, or to lose weight. Ask what an ideal weight is for you.  Get at least 30 minutes of exercise that causes your heart to beat faster (aerobic exercise) most days of the week. Activities may include  walking, swimming, or biking.  Include exercise to strengthen your muscles (resistance exercise), such as weight lifting, as part of your weekly exercise routine. Try to do these types of exercises for 30 minutes at least 3 days a week.  Do not use any products that contain nicotine or tobacco, such as cigarettes and e-cigarettes. If you need help quitting, ask your health care provider.  Control any long-term (chronic) conditions you have, such as high cholesterol or diabetes. Monitoring  Monitor your blood pressure at home as told by your health care provider. Your personal target blood pressure may vary depending on your medical conditions, your age, and other factors.  Have your blood pressure checked regularly, as often as told by your health care provider. Working with your health care provider  Review all the medicines you take with your health care provider because there may be side effects or interactions.  Talk with your health care provider about your diet, exercise habits, and other lifestyle factors that may be contributing to hypertension.  Visit your health care provider regularly. Your health care provider can help you create and adjust your plan for managing hypertension. Will I need medicine to control my blood pressure? Your health care provider may prescribe medicine if lifestyle changes are not enough to get your blood pressure under control, and if:  Your systolic blood pressure is 130 or higher.  Your diastolic blood pressure is 80 or higher. Take medicines only as told by your health care provider. Follow the directions carefully. Blood pressure medicines must be taken as prescribed. The medicine does not work as well when you skip doses. Skipping doses also puts you at risk for problems. Contact a health care provider if:  You think you are having a reaction to medicines you have taken.  You have repeated (recurrent) headaches.  You feel dizzy.  You have  swelling in your ankles.  You have trouble with your vision. Get help right away if:  You develop a severe headache or confusion.  You have unusual weakness or numbness, or you feel faint.  You have severe pain in your chest or abdomen.  You vomit repeatedly.  You have trouble breathing. Summary  Hypertension is when the force of blood pumping through your arteries is too strong. If this condition is not controlled, it may put you at risk for serious complications.  Your personal target blood pressure may vary depending on your medical conditions, your age, and other factors. For most people, a normal blood pressure is less than 120/80.  Hypertension is managed by lifestyle changes, medicines, or both. Lifestyle changes include weight loss, eating a healthy, low-sodium diet, exercising more, and limiting alcohol. This information is not intended to replace advice given to you by your health care provider. Make sure you discuss any questions you have with your health care provider. Document Revised: 10/02/2018 Document Reviewed: 05/08/2016 Elsevier Patient Education  [employer].   If you have lab work done today you will be contacted with your lab results within the next 2 weeks.  If you have not heard from [place] then  please contact us. The fastest way to get your results is to register for My Chart.   IF you received an x-ray today, you will receive an invoice from [facility]. Please contact [facility] at [phone] with questions or concerns regarding your invoice.   IF you received labwork today, you will receive an invoice from [place]. Please contact [facility] at [phone] with questions or concerns regarding your invoice.   Our billing staff will not be able to assist you with questions regarding bills from these companies.  You will be contacted with the lab results as soon as they are available. The fastest way to get your results is to activate  your My Chart account. Instructions are located on the last page of this paperwork. If you have not heard from [place] regarding the results in 2 weeks, please contact this office.

## 2020-03-10 NOTE — Progress Notes (Signed)
Subjective:  Patient ID: Seth Carson, male    DOB: [birth year]  Age: 59 y.o. MRN: [id number]  CC:  Chief Complaint  Patient presents with  . Follow-up    for hypertension. PT reports ni issues with BP since last OV, but hasn't been checking BP at home. PT reports having headaches once or twice since last OV no other physical symptoms since last visit.    HPI Seth Carson presents for   Hypertension: Last seen August 5.  On amlodipine 10 mg, hydrochlorothiazide 25 mg, potassium 20 mEq daily at that time.  He does have history of structural sleep apnea, central sleep apnea/complex sleep apnea treated by Dr. Frances Carson, treated with nightly BiPAP.   Some increased restaurant food August 5 visit, and had been out of potassium.  Previously on lisinopril but was discontinued when he had improved blood pressure control..   Planned diet changes after last visit, continue same regimen with option of adding back lisinopril if persistent elevation  Home readings: no recent home readings.  Did start lisinopril daily and adjusted diet some - avoiding fast food.  No new headaches.  Slight cough with cold sx's 2 weeks ago, cough improved, no fever.  Exercise with work at home.  BP Readings from Last 3 Encounters:  03/10/20 131/79  01/27/20 (!) 149/88  01/25/19 (!) 150/90   Lab Results  Component Value Date   CREATININE 1.08 01/27/2020   Hypokalemia Previously on supplement, intermittent use, recommended daily supplementation last visit.  Plan on repeat testing today.  Denies new palpitations. Taking qd.  Lab Results  Component Value Date   K 2.9 (L) 01/27/2020    History Patient Active Problem List   Diagnosis Date Noted  . Unspecified sleep apnea 08/17/2013  . HTN (hypertension) 06/10/2013  . Hyperlipidemia 06/10/2013   Past Medical History:  Diagnosis Date  . Hyperlipidemia   . Hypertension   . Sleep apnea    uses BI-PAP    Past Surgical History:  Procedure  Laterality Date  . DENTAL EXAMINATION UNDER ANESTHESIA  at age 24   No Known Allergies Prior to Admission medications   Medication Sig Start Date End Date Taking? Authorizing Provider  amLODipine (NORVASC) 10 MG tablet Take 1 tablet (10 mg total) by mouth daily. 01/27/20  Yes [name], MD  aspirin EC 81 MG tablet Take 81 mg by mouth daily.   Yes [provider]  hydrochlorothiazide (HYDRODIURIL) 25 MG tablet Take 1 tablet (25 mg total) by mouth daily. 01/27/20  Yes [name], MD  lisinopril (ZESTRIL) 10 MG tablet Take 1 tablet (10 mg total) by mouth daily. 01/27/20  Yes [name], MD  potassium chloride SA (KLOR-CON) 20 MEQ tablet Take 1 tablet (20 mEq total) by mouth daily. T 01/27/20  Yes [name], MD  rosuvastatin (CRESTOR) 20 MG tablet Take 1 tablet (20 mg total) by mouth daily. 01/27/20  Yes [name], MD   Social History   Socioeconomic History  . Marital status: Single    Spouse name: n/a  . Number of children: 0  . Years of education: 32  . Highest education level: Not on file  Occupational History  . Occupation: Systems developer  Tobacco Use  . Smoking status: Never Smoker  . Smokeless tobacco: Never Used  Vaping Use  . Vaping Use: Never used  Substance and Sexual Activity  . Alcohol use: Not Currently  . Drug use: No  . Sexual activity: Not  on file  Other Topics Concern  . Not on file  Social History Narrative   Marital status: single;       Children:  None       Lives with his mother "she won't let me leave."      Employment:  [employer] x 21 years; repairman; happy      Tobacco: none      Alcohol:  none      Exercise:  Sporadic.  Some walking.   Social Determinants of Health   Financial Resource Strain:   . Difficulty of Paying Living Expenses: Not on file  Food Insecurity:   . Worried About Programme researcher, broadcasting/film/video in the Last Year: Not on file  . Ran Out of Food in the Last Year: Not on file  Transportation Needs:    . Lack of Transportation (Medical): Not on file  . Lack of Transportation (Non-Medical): Not on file  Physical Activity:   . Days of Exercise per Week: Not on file  . Minutes of Exercise per Session: Not on file  Stress:   . Feeling of Stress : Not on file  Social Connections:   . Frequency of Communication with Friends and Family: Not on file  . Frequency of Social Gatherings with Friends and Family: Not on file  . Attends Religious Services: Not on file  . Active Member of Clubs or Organizations: Not on file  . Attends Banker Meetings: Not on file  . Marital Status: Not on file  Intimate Partner Violence:   . Fear of Current or Ex-Partner: Not on file  . Emotionally Abused: Not on file  . Physically Abused: Not on file  . Sexually Abused: Not on file    Review of Systems  Constitutional: Negative for fatigue and unexpected weight change.  Eyes: Negative for visual disturbance.  Respiratory: Negative for cough, chest tightness and shortness of breath.   Cardiovascular: Negative for chest pain, palpitations and leg swelling.  Gastrointestinal: Negative for abdominal pain and blood in stool.  Neurological: Negative for dizziness, light-headedness and headaches.     Objective:   Vitals:   03/10/20 1112  BP: 131/79  Pulse: 77  Temp: 98.1 F (36.7 C)  TempSrc: Temporal  SpO2: 95%  Weight: 238 lb (108 kg)  Height: 5\' 10"  (1.778 m)     Physical Exam Vitals reviewed.  Constitutional:      Appearance: He is well-developed.  HENT:     Head: Normocephalic and atraumatic.  Eyes:     Pupils: Pupils are equal, round, and reactive to light.  Neck:     Vascular: No carotid bruit or JVD.  Cardiovascular:     Rate and Rhythm: Normal rate and regular rhythm.     Heart sounds: Normal heart sounds. No murmur heard.   Pulmonary:     Effort: Pulmonary effort is normal.     Breath sounds: Normal breath sounds. No rales.  Skin:    General: Skin is warm and  dry.  Neurological:     Mental Status: He is alert and oriented to person, place, and time.      Assessment & Plan:  Seth Carson is a 59 y.o. male . Essential hypertension  -  Stable, tolerating current regimen.  Continue lisinopril, option of coming off that medication again if well controlled.  Commended on dietary changes.  Continue HCTZ, amlodipine same dose.  Need for prophylactic vaccination and inoculation against influenza - Plan: Flu Vaccine QUAD  36+ mos IM  Hypokalemia  - repeat labs. Continue supplement same dose for now.   No orders of the defined types were placed in this encounter.  Patient Instructions       If you have lab work done today you will be contacted with your lab results within the next 2 weeks.  If you have not heard from [place] then please contact us. The fastest way to get your results is to register for My Chart.   IF you received an x-ray today, you will receive an invoice from [facility]. Please contact [facility] at [phone] with questions or concerns regarding your invoice.   IF you received labwork today, you will receive an invoice from [place]. Please contact [facility] at [phone] with questions or concerns regarding your invoice.   Our billing staff will not be able to assist you with questions regarding bills from these companies.  You will be contacted with the lab results as soon as they are available. The fastest way to get your results is to activate your My Chart account. Instructions are located on the last page of this paperwork. If you have not heard from [place] regarding the results in 2 weeks, please contact this office.         Signed, Meredith Staggers, MD [facility]

## 2020-08-15 ENCOUNTER — Other Ambulatory Visit: Payer: Self-pay | Admitting: Family Medicine

## 2020-08-15 DIAGNOSIS — I10 Essential (primary) hypertension: Secondary | ICD-10-CM

## 2020-08-15 NOTE — Telephone Encounter (Signed)
Requested Prescriptions  Pending Prescriptions Disp Refills  . lisinopril (ZESTRIL) 10 MG tablet [Pharmacy Med Name: Lisinopril 10 MG Oral Tablet] 90 tablet 0    Sig: Take 1 tablet by mouth once daily     Cardiovascular:  ACE Inhibitors Failed - 08/15/2020  7:53 AM      Failed - K in normal range and within 180 days    Potassium  Date Value Ref Range Status  03/10/2020 3.3 (L) 3.5 - 5.2 mmol/L Final         Passed - Cr in normal range and within 180 days    Creat  Date Value Ref Range Status  04/18/2016 1.08 0.70 - 1.33 mg/dL Final    Comment:      For patients > or = 58 years of age: The upper reference limit for Creatinine is approximately 13% higher for people identified as African-American.      Creatinine, Ser  Date Value Ref Range Status  03/10/2020 1.04 0.76 - 1.27 mg/dL Final         Passed - Patient is not pregnant      Passed - Last BP in normal range    BP Readings from Last 1 Encounters:  03/10/20 131/79         Passed - Valid encounter within last 6 months    Recent Outpatient Visits          5 months ago Essential hypertension   [facility], Asencion Partridge, MD   6 months ago Annual physical exam   [facility], Asencion Partridge, MD   1 year ago Annual physical exam   [facility], Asencion Partridge, MD   1 year ago Cough   [facility], Asencion Partridge, MD   2 years ago Mixed hyperlipidemia   [facility], Asencion Partridge, MD      Future Appointments            In 3 weeks Neva Seat Asencion Partridge, MD [facility]

## 2020-09-07 ENCOUNTER — Other Ambulatory Visit: Payer: Self-pay

## 2020-09-07 ENCOUNTER — Encounter: Payer: Self-pay | Admitting: Family Medicine

## 2020-09-07 ENCOUNTER — Telehealth (INDEPENDENT_AMBULATORY_CARE_PROVIDER_SITE_OTHER): Payer: No Typology Code available for payment source | Admitting: Family Medicine

## 2020-09-07 VITALS — Ht 70.0 in | Wt 215.0 lb

## 2020-09-07 DIAGNOSIS — E782 Mixed hyperlipidemia: Secondary | ICD-10-CM | POA: Diagnosis not present

## 2020-09-07 DIAGNOSIS — G4733 Obstructive sleep apnea (adult) (pediatric): Secondary | ICD-10-CM | POA: Diagnosis not present

## 2020-09-07 DIAGNOSIS — I10 Essential (primary) hypertension: Secondary | ICD-10-CM | POA: Diagnosis not present

## 2020-09-07 DIAGNOSIS — E876 Hypokalemia: Secondary | ICD-10-CM | POA: Diagnosis not present

## 2020-09-07 MED ORDER — AMLODIPINE BESYLATE 10 MG PO TABS: 10.0000 mg | ORAL_TABLET | Freq: Every day | ORAL | 2 refills | Status: DC

## 2020-09-07 MED ORDER — ROSUVASTATIN CALCIUM 20 MG PO TABS: 20.0000 mg | ORAL_TABLET | Freq: Every day | ORAL | 2 refills | Status: DC

## 2020-09-07 MED ORDER — LISINOPRIL 10 MG PO TABS
10.0000 mg | ORAL_TABLET | Freq: Every day | ORAL | 1 refills | Status: DC
Start: 2020-09-07 — End: 2021-11-29

## 2020-09-07 MED ORDER — HYDROCHLOROTHIAZIDE 25 MG PO TABS
25.0000 mg | ORAL_TABLET | Freq: Every day | ORAL | 2 refills | Status: DC
Start: 2020-09-07 — End: 2021-11-29

## 2020-09-07 MED ORDER — POTASSIUM CHLORIDE CRYS ER 20 MEQ PO TBCR: 20.0000 meq | EXTENDED_RELEASE_TABLET | Freq: Every day | ORAL | 2 refills | Status: DC

## 2020-09-07 NOTE — Progress Notes (Signed)
Virtual Visit via Telephone Note  I connected with Seth Carson on 09/07/20 at 10:28 AM by telephone and verified that I am speaking with the correct person using two identifiers. Patient location:home My location: home   I discussed the limitations, risks, security and privacy concerns of performing an evaluation and management service by telephone and the availability of in person appointments. I also discussed with the patient that there may be a patient responsible charge related to this service. The patient expressed understanding and agreed to proceed, consent obtained  Chief complaint: Chief Complaint  Patient presents with  . Follow-up    On hypertension, lab work, and med refills. Pt reports no issues with BP since last OV. Pt reports his current medications seem to be working well with no side effects. Pt reports no physical symptoms of this condition.   History of Present Illness: Seth Carson is a 60 y.o. male   Hypertension: With history of obstructive sleep apnea, complex/central sleep apnea treated by Dr. Frances Furbish, BiPAP. Improved blood pressure control last visit with restart of lisinopril 10 mg daily.  Was also taking amlodipine 10 mg daily, HCTZ 25 mg daily.  History of hypokalemia, was taking potassium 20 mEq daily, but intermittent use in the past.  Still taking all 3 antihypertensives.  Potassium - ran out of Rx past 6 weeks or so. Eating better, potassium rich foods, and has been taking 200mg  otc supplement.   Home readings: usually 120/70 range  Using bipap - sleeping ok at this time.   Constitutional: Negative for fatigue and unexpected weight change.  Eyes: Negative for visual disturbance.  Respiratory: Negative for cough, chest tightness and shortness of breath.   Cardiovascular: Negative for chest pain, palpitations and leg swelling.  Gastrointestinal: Negative for abdominal pain and blood in stool.  Neurological: Negative for dizziness,  light-headedness and headaches.   BP Readings from Last 3 Encounters:  03/10/20 131/79  01/27/20 (!) 149/88  01/25/19 (!) 150/90   Lab Results  Component Value Date   CREATININE 1.04 03/10/2020   Lab Results  Component Value Date   K 3.3 (L) 03/10/2020   Hyperlipidemia: Crestor 20 mg daily. No new myalgias or side effects. Exercise has been limited with life stressors and time constraints. Work changes - now part of [employer], prior 03/12/2020. Trying to incorporate small amounts of exercise as able.   Lab Results  Component Value Date   CHOL 160 01/27/2020   HDL 41 01/27/2020   LDLCALC 85 01/27/2020   TRIG 202 (H) 01/27/2020   CHOLHDL 3.9 01/27/2020   Lab Results  Component Value Date   ALT 28 01/27/2020   AST 24 01/27/2020   ALKPHOS 64 01/27/2020   BILITOT 0.9 01/27/2020    He is also followed by [facility], for glaucoma     Patient Active Problem List   Diagnosis Date Noted  . Unspecified sleep apnea 08/17/2013  . HTN (hypertension) 06/10/2013  . Hyperlipidemia 06/10/2013   Past Medical History:  Diagnosis Date  . Hyperlipidemia   . Hypertension   . Sleep apnea    uses BI-PAP    Past Surgical History:  Procedure Laterality Date  . DENTAL EXAMINATION UNDER ANESTHESIA  at age 32   No Known Allergies Prior to Admission medications   Medication Sig Start Date End Date Taking? Authorizing Provider  amLODipine (NORVASC) 10 MG tablet Take 1 tablet (10 mg total) by mouth daily. 01/27/20  Yes 03/28/20, MD  aspirin EC 81 MG tablet Take 81 mg by mouth daily.   Yes [provider]  hydrochlorothiazide (HYDRODIURIL) 25 MG tablet Take 1 tablet (25 mg total) by mouth daily. 01/27/20  Yes [name], MD  lisinopril (ZESTRIL) 10 MG tablet Take 1 tablet by mouth once daily 08/15/20  Yes [name], MD  potassium chloride SA (KLOR-CON) 20 MEQ tablet Take 1 tablet (20 mEq total) by mouth daily. T 01/27/20  Yes [name], MD  rosuvastatin (CRESTOR) 20 MG tablet Take 1 tablet (20 mg total) by mouth daily. 01/27/20  Yes [name], MD   Social History   Socioeconomic History  . Marital status: Single    Spouse name: n/a  . Number of children: 0  . Years of education: 65  . Highest education level: Not on file  Occupational History  . Occupation: Systems developer  Tobacco Use  . Smoking status: Never Smoker  . Smokeless tobacco: Never Used  Vaping Use  . Vaping Use: Never used  Substance and Sexual Activity  . Alcohol use: Not Currently  . Drug use: No  . Sexual activity: Not on file  Other Topics Concern  . Not on file  Social History Narrative   Marital status: single;       Children:  None       Lives with his mother "she won't let me leave."      Employment:  [employer] x 21 years; repairman; happy      Tobacco: none      Alcohol:  none      Exercise:  Sporadic.  Some walking.   Social Determinants of Health   Financial Resource Strain: Not on file  Food Insecurity: Not on file  Transportation Needs: Not on file  Physical Activity: Not on file  Stress: Not on file  Social Connections: Not on file  Intimate Partner Violence: Not on file     Observations/Objective: Vitals:   09/07/20 0838  Weight: 215 lb (97.5 kg)  Height: 5\' 10"  (1.778 m)  142/84 this am. Has not yet taken meds.  No distress over video, nontoxic appearance.  Normal speech.  Euthymic mood.  Normal respiratory effort.  No focal weakness appreciated.  All questions were answered with understanding of plan expressed.  Assessment and Plan: Essential hypertension  -Stable on home readings, continue same regimen.  Check labs  Hypokalemia  -Eating potassium rich foods, over-the-counter supplement.  Check labs to determine if restart prescription dose, was sent to pharmacy.  Mixed hyperlipidemia  -Tolerating statin, continue same.  Labs ordered  OSA treated with BiPAP  -Stable, continue  BiPAP.  Follow Up Instructions: 6 months, physical Patient Instructions   Good talking with you today.  No change in medications for now.  Depending on potassium level may need to restart prescription potassium -  I have sent that to your pharmacy.  Walking or other low intensity exercise as you are able with ultimate goal of 150 minutes/week.  Please let me know if there are questions. follow-up in 6 months.   Here are a few lab drawing stations for you to have your labwork performed:  [facility] [address] [place], [place]  [facility] [address] [place], [place]   Here is the address for my new office [facility] Address: [address] [place], [place] Phone: [phone]    If you have lab work done today  you will be contacted with your lab results within the next 2 weeks.  If you have not heard from [place] then please contact us. The fastest way to get your results is to register for My Chart.   IF you received an x-ray today, you will receive an invoice from [facility]. Please contact [facility] at [phone] with questions or concerns regarding your invoice.   IF you received labwork today, you will receive an invoice from [place]. Please contact [facility] at [phone] with questions or concerns regarding your invoice.   Our billing staff will not be able to assist you with questions regarding bills from these companies.  You will be contacted with the lab results as soon as they are available. The fastest way to get your results is to activate your My Chart account. Instructions are located on the last page of this paperwork. If you have not heard from [place] regarding the results in 2 weeks, please contact this office.         I discussed the assessment and treatment plan with the patient. The patient was provided an opportunity to ask questions and all were answered. The patient agreed with  the plan and demonstrated an understanding of the instructions.   The patient was advised to call back or seek an in-person evaluation if the symptoms worsen or if the condition fails to improve as anticipated.  I provided 26 minutes of non-face-to-face time during this encounter.  Signed,   Meredith Staggers, MD [facility].  09/07/20

## 2020-09-07 NOTE — Patient Instructions (Addendum)
Good talking with you today.  No change in medications for now.  Depending on potassium level may need to restart prescription potassium -  I have sent that to your pharmacy.  Walking or other low intensity exercise as you are able with ultimate goal of 150 minutes/week.  Please let me know if there are questions. follow-up in 6 months.   Here are a few lab drawing stations for you to have your labwork performed:  [facility] [address] [place], [place] [address]  [facility] [address] [place], [place] [address]   Here is the address for my new office [facility] Address: [address] [place] [address] [place], [place], [place] [address] Phone: [phone]    If you have lab work done today you will be contacted with your lab results within the next 2 weeks.  If you have not heard from [place] then please contact us. The fastest way to get your results is to register for My Chart.   IF you received an x-ray today, you will receive an invoice from [facility]. Please contact [facility] at [phone] with questions or concerns regarding your invoice.   IF you received labwork today, you will receive an invoice from [facility]. Please contact [facility] at [phone] with questions or concerns regarding your invoice.   Our billing staff will not be able to assist you with questions regarding bills from these companies.  You will be contacted with the lab results as soon as they are available. The fastest way to get your results is to activate your My Chart account. Instructions are located on the last page of this paperwork. If you have not heard from [place] regarding the results in 2 weeks, please contact this office.

## 2020-09-08 LAB — COMPREHENSIVE METABOLIC PANEL
ALT: 23 IU/L (ref 0–44)
AST: 19 IU/L (ref 0–40)
Albumin/Globulin Ratio: 1.6 (ref 1.2–2.2)
Albumin: 4.5 g/dL (ref 3.8–4.9)
Alkaline Phosphatase: 60 IU/L (ref 44–121)
BUN/Creatinine Ratio: 11 (ref 9–20)
BUN: 12 mg/dL (ref 6–24)
Bilirubin Total: 0.6 mg/dL (ref 0.0–1.2)
CO2: 25 mmol/L (ref 20–29)
Calcium: 9.8 mg/dL (ref 8.7–10.2)
Chloride: 98 mmol/L (ref 96–106)
Creatinine, Ser: 1.05 mg/dL (ref 0.76–1.27)
Globulin, Total: 2.9 g/dL (ref 1.5–4.5)
Glucose: 97 mg/dL (ref 65–99)
Potassium: 3.4 mmol/L — ABNORMAL LOW (ref 3.5–5.2)
Sodium: 140 mmol/L (ref 134–144)
Total Protein: 7.4 g/dL (ref 6.0–8.5)
eGFR: 83 mL/min/{1.73_m2} (ref >59)

## 2020-09-08 LAB — LIPID PANEL
Chol/HDL Ratio: 4.2 ratio (ref 0.0–5.0)
Cholesterol, Total: 147 mg/dL (ref 100–199)
HDL: 35 mg/dL — ABNORMAL LOW (ref >39)
LDL Chol Calc (NIH): 82 mg/dL (ref 0–99)
Triglycerides: 175 mg/dL — ABNORMAL HIGH (ref 0–149)
VLDL Cholesterol Cal: 30 mg/dL (ref 5–40)

## 2021-08-07 ENCOUNTER — Other Ambulatory Visit: Payer: Self-pay | Admitting: Family Medicine

## 2021-08-07 DIAGNOSIS — I10 Essential (primary) hypertension: Secondary | ICD-10-CM

## 2021-08-07 DIAGNOSIS — E782 Mixed hyperlipidemia: Secondary | ICD-10-CM

## 2021-11-29 ENCOUNTER — Ambulatory Visit (INDEPENDENT_AMBULATORY_CARE_PROVIDER_SITE_OTHER)
Admission: RE | Admit: 2021-11-29 | Discharge: 2021-11-29 | Disposition: A | Discharge status: Home / Self Care | Payer: No Typology Code available for payment source | Source: Ambulatory Visit | Attending: Family Medicine | Admitting: Family Medicine

## 2021-11-29 ENCOUNTER — Telehealth: Payer: Self-pay | Admitting: Family Medicine

## 2021-11-29 ENCOUNTER — Ambulatory Visit (INDEPENDENT_AMBULATORY_CARE_PROVIDER_SITE_OTHER): Payer: No Typology Code available for payment source | Admitting: Family Medicine

## 2021-11-29 VITALS — BP 196/102 | HR 78 | Temp 98.0°F | Resp 16 | Ht 70.0 in | Wt 241.6 lb

## 2021-11-29 DIAGNOSIS — R0609 Other forms of dyspnea: Secondary | ICD-10-CM

## 2021-11-29 DIAGNOSIS — R0989 Other specified symptoms and signs involving the circulatory and respiratory systems: Secondary | ICD-10-CM | POA: Diagnosis not present

## 2021-11-29 DIAGNOSIS — E782 Mixed hyperlipidemia: Secondary | ICD-10-CM | POA: Diagnosis not present

## 2021-11-29 DIAGNOSIS — I10 Essential (primary) hypertension: Secondary | ICD-10-CM | POA: Diagnosis not present

## 2021-11-29 DIAGNOSIS — G4733 Obstructive sleep apnea (adult) (pediatric): Secondary | ICD-10-CM | POA: Diagnosis not present

## 2021-11-29 DIAGNOSIS — E876 Hypokalemia: Secondary | ICD-10-CM

## 2021-11-29 LAB — CBC
HCT: 47.7 % (ref 39.0–52.0)
Hemoglobin: 15.7 g/dL (ref 13.0–17.0)
MCHC: 32.9 g/dL (ref 30.0–36.0)
MCV: 84.2 fl (ref 78.0–100.0)
Platelets: 210 10*3/uL (ref 150.0–400.0)
RBC: 5.67 Mil/uL (ref 4.22–5.81)
RDW: 14.6 % (ref 11.5–15.5)
WBC: 5.9 10*3/uL (ref 4.0–10.5)

## 2021-11-29 LAB — LIPID PANEL
Cholesterol: 347 mg/dL — ABNORMAL HIGH (ref 0–200)
HDL: 41.1 mg/dL (ref >39.00)
NonHDL: 306.1
Total CHOL/HDL Ratio: 8
Triglycerides: 343 mg/dL — ABNORMAL HIGH (ref 0.0–149.0)
VLDL: 68.6 mg/dL — ABNORMAL HIGH (ref 0.0–40.0)

## 2021-11-29 LAB — LDL CHOLESTEROL, DIRECT: Direct LDL: 122 mg/dL

## 2021-11-29 MED ORDER — AMLODIPINE BESYLATE 10 MG PO TABS: 10.0000 mg | ORAL_TABLET | Freq: Every day | ORAL | 2 refills | Status: DC

## 2021-11-29 MED ORDER — ROSUVASTATIN CALCIUM 20 MG PO TABS: 20.0000 mg | ORAL_TABLET | Freq: Every day | ORAL | 2 refills | Status: DC

## 2021-11-29 MED ORDER — LISINOPRIL 10 MG PO TABS: 10.0000 mg | ORAL_TABLET | Freq: Every day | ORAL | 1 refills | Status: DC

## 2021-11-29 MED ORDER — HYDROCHLOROTHIAZIDE 25 MG PO TABS: 25.0000 mg | ORAL_TABLET | Freq: Every day | ORAL | 2 refills | Status: DC

## 2021-11-29 NOTE — Telephone Encounter (Signed)
Ms. Seth Carson wants to give lab result about pt.  She will be there until 5:30 today. [phone]

## 2021-11-29 NOTE — Progress Notes (Signed)
Subjective:  Patient ID: Seth Carson, male    DOB: 06/15/1961  Age: 61 y.o. MRN: [id number]  CC:  Chief Complaint  Patient presents with   Hypertension    Pt here for recheck on BP, in need of refill  ran out of medication about one month ago has been out of lisinoril and amlodipine has some HCTZ left     HPI Seth Carson presents for  Follow-up, last visit March 2022.denies barriers to care, just work - [employer]. Out of town 1 week per month.   Hypertension: With history of OSA on BiPAP for complex/central sleep apnea, sleep specialist Dr. Frances Furbish Antihypertensives amlodipine 10 mg daily, HCTZ 25 mg daily, lisinopril 10 mg daily.  Potassium supplementation for hypokalemia previously.  Potassium rich foods.  Home readings stable at his video visit last March. Unfortunately his been out of 2 of his medications over the past month, amlodipine and lisinopril.  Has continued to take hydrochlorothiazide. Still using bipap.  Slight chest congestion feeling this am, slight dyspnea with exertion walking fast long distances only, ok with routine pace, also with 2nd flight of stairs - past week or so. No arm/leg swelling or weight gain. No cough, no chest pain.  Home readings on meds - 120-130/68-72. Not taking potassium supplement - potassium foods only.   Constitutional: Negative for fatigue and unexpected weight change.  Eyes: Negative for visual disturbance.  Respiratory: Negative for cough, chest tightness.   Cardiovascular: Negative for chest pain, palpitations and leg swelling.  Gastrointestinal: Negative for abdominal pain and blood in stool.  Neurological: Negative for dizziness, light-headedness and headaches.   BP Readings from Last 3 Encounters:  11/29/21 (!) 196/102  03/10/20 131/79  01/27/20 (!) 149/88   Lab Results  Component Value Date   CREATININE 1.05 09/08/2020    Hyperlipidemia: Crestor 20 mg daily.  Exercise limited with work previously. Ran out of crestor  month ago.  No side effects or myalgias on meds.  Fasting today.  Lab Results  Component Value Date   CHOL 147 09/08/2020   HDL 35 (L) 09/08/2020   LDLCALC 82 09/08/2020   TRIG 175 (H) 09/08/2020   CHOLHDL 4.2 09/08/2020   Lab Results  Component Value Date   ALT 23 09/08/2020   AST 19 09/08/2020   ALKPHOS 60 09/08/2020   BILITOT 0.6 09/08/2020   Health maintenance: Shingles vaccine - recommended - deferred today.  COVID booster: recommended   History Patient Active Problem List   Diagnosis Date Noted   Unspecified sleep apnea 08/17/2013   HTN (hypertension) 06/10/2013   Hyperlipidemia 06/10/2013   Past Medical History:  Diagnosis Date   Hyperlipidemia    Hypertension    Sleep apnea    uses BI-PAP    Past Surgical History:  Procedure Laterality Date   DENTAL EXAMINATION UNDER ANESTHESIA  at age 54   No Known Allergies Prior to Admission medications   Medication Sig Start Date End Date Taking? Authorizing Provider  amLODipine (NORVASC) 10 MG tablet Take 1 tablet (10 mg total) by mouth daily. 09/07/20   [name], MD  aspirin EC 81 MG tablet Take 81 mg by mouth daily.    [provider]  hydrochlorothiazide (HYDRODIURIL) 25 MG tablet Take 1 tablet (25 mg total) by mouth daily. 09/07/20   [name], MD  lisinopril (ZESTRIL) 10 MG tablet Take 1 tablet (10 mg total) by mouth daily. 09/07/20   [name], MD  potassium chloride SA (KLOR-CON)  20 MEQ tablet Take 1 tablet (20 mEq total) by mouth daily. T 09/07/20   Shade FloodGreene, Azalie Harbeck R, MD  rosuvastatin (CRESTOR) 20 MG tablet Take 1 tablet (20 mg total) by mouth daily. 09/07/20   Shade FloodGreene, Pamela Intrieri R, MD   Social History   Socioeconomic History   Marital status: Single    Spouse name: n/a   Number of children: 0   Years of education: 12   Highest education level: Not on file  Occupational History   Occupation: Systems developercopier technician  Tobacco Use   Smoking status: Never   Smokeless tobacco: Never   Vaping Use   Vaping Use: Never used  Substance and Sexual Activity   Alcohol use: Not Currently   Drug use: No   Sexual activity: Not on file  Other Topics Concern   Not on file  Social History Narrative   Marital status: single;       Children:  None       Lives with his mother "she won't let me leave."      Employment:  [employer] x 21 years; repairman; happy      Tobacco: none      Alcohol:  none      Exercise:  Sporadic.  Some walking.   Social Determinants of Health   Financial Resource Strain: Not on file  Food Insecurity: Not on file  Transportation Needs: Not on file  Physical Activity: Not on file  Stress: Not on file  Social Connections: Not on file  Intimate Partner Violence: Not on file    Review of Systems Per HPI.   Objective:   Vitals:   11/29/21 0815 11/29/21 0820  BP: (!) 208/104 (!) 196/102  Pulse: 78   Resp: 16   Temp: 98 F (36.7 C)   TempSrc: Temporal   SpO2: 94%   Weight: 241 lb 9.6 oz (109.6 kg)   Height: 5\' 10"  (1.778 m)      Physical Exam Vitals reviewed.  Constitutional:      General: He is not in acute distress.    Appearance: Normal appearance. He is well-developed. He is not ill-appearing, toxic-appearing or diaphoretic.  HENT:     Head: Normocephalic and atraumatic.  Neck:     Vascular: No carotid bruit or JVD.  Cardiovascular:     Rate and Rhythm: Normal rate and regular rhythm.     Heart sounds: Normal heart sounds. No murmur heard. Pulmonary:     Effort: Pulmonary effort is normal. No respiratory distress.     Breath sounds: Normal breath sounds. No wheezing or rales.  Musculoskeletal:     Right lower leg: Edema (trace to 1+ LE bilat.) present.     Left lower leg: Edema present.  Skin:    General: Skin is warm and dry.  Neurological:     Mental Status: He is alert and oriented to person, place, and time.  Psychiatric:        Mood and Affect: Mood normal.     EKG sinus rhythm, bradycardia with rate of 58.   Left axis.  Voltage criteria for LVH borderline ST depression in V6 but minimal change from previous EKG in August 2020.  Also similar ST segment in lead III compared to August 2020.  No apparent acute findings.   Assessment & Plan:  Seth Densellan Baltimore is a 61 y.o. male . Essential hypertension - Plan: amLODipine (NORVASC) 10 MG tablet, hydrochlorothiazide (HYDRODIURIL) 25 MG tablet, lisinopril (ZESTRIL) 10 MG tablet, Comprehensive metabolic panel  -  Uncontrolled, with medication adherence.  No apparent acute findings on EKG.  We will check some labs, further evaluation for dyspnea exertion below with ER precautions given.  Recheck 1 week, consider cardiology eval..  OSA treated with BiPAP  -Continue same.  Compliant with BiPAP.  Hypokalemia  -Off supplementation, checked labs.    Received note that potassium low at 2.7, attempted to call patient on June 9 at 2:04 PM, left voicemail to call back for instructions.  At that level should be seen through the ER for repeat testing and repletion.  Mixed hyperlipidemia - Plan: rosuvastatin (CRESTOR) 20 MG tablet, Lipid panel  -Uncontrolled off meds, restart Crestor with close follow-up and repeat testing.  DOE (dyspnea on exertion) - Plan: CBC, Pro b natriuretic peptide, EKG 12-Lead, DG Chest 2 View Uncontrolled hypertension - Plan: Comprehensive metabolic panel, Pro b natriuretic peptide, EKG 12-Lead, DG Chest 2 View Chest congestion - Plan: CBC, Pro b natriuretic peptide, EKG 12-Lead, DG Chest 2 View  -BNP normal, no sign of edema on chest x-ray.  EKG without acute findings as above but ER precautions given if any acute worsening symptoms.  Restart antihypertensives as above, with close follow-up and ER precautions.  Meds ordered this encounter  Medications   amLODipine (NORVASC) 10 MG tablet    Sig: Take 1 tablet (10 mg total) by mouth daily.    Dispense:  90 tablet    Refill:  2   hydrochlorothiazide (HYDRODIURIL) 25 MG tablet    Sig: Take 1  tablet (25 mg total) by mouth daily.    Dispense:  90 tablet    Refill:  2   lisinopril (ZESTRIL) 10 MG tablet    Sig: Take 1 tablet (10 mg total) by mouth daily.    Dispense:  90 tablet    Refill:  1   rosuvastatin (CRESTOR) 20 MG tablet    Sig: Take 1 tablet (20 mg total) by mouth daily.    Dispense:  90 tablet    Refill:  2   Patient Instructions  Although your EKG does not appear normal, it appears similar to your previous EKG in 2020.  Some of the findings may be due to uncontrolled blood pressure and strain.  If you have any chest pain, tightness, worsening shortness of breath or worsening chest congestion, call 911 or proceed to the emergency room as we discussed.  I am checking some blood work today to look for heart failure but please have chest x-ray done today as well.  Recheck in 1 week to review your blood pressures back on medication.  Let me know if there are questions in the meantime. Return to the clinic or go to the nearest emergency room if any of your symptoms worsen or new symptoms occur.     Signed,   Meredith Staggers, MD [facility], [facility] 11/29/21 8:56 AM

## 2021-11-29 NOTE — Patient Instructions (Signed)
Although your EKG does not appear normal, it appears similar to your previous EKG in 2020.  Some of the findings may be due to uncontrolled blood pressure and strain.  If you have any chest pain, tightness, worsening shortness of breath or worsening chest congestion, call 911 or proceed to the emergency room as we discussed.  I am checking some blood work today to look for heart failure but please have chest x-ray done today as well.  Recheck in 1 week to review your blood pressures back on medication.  Let me know if there are questions in the meantime. Return to the clinic or go to the nearest emergency room if any of your symptoms worsen or new symptoms occur.

## 2021-11-30 ENCOUNTER — Encounter (HOSPITAL_BASED_OUTPATIENT_CLINIC_OR_DEPARTMENT_OTHER): Payer: Self-pay

## 2021-11-30 ENCOUNTER — Telehealth: Payer: Self-pay

## 2021-11-30 ENCOUNTER — Emergency Department (HOSPITAL_BASED_OUTPATIENT_CLINIC_OR_DEPARTMENT_OTHER)
Admission: EM | Admit: 2021-11-30 | Discharge: 2021-11-30 | Disposition: A | Discharge status: Home / Self Care | Payer: No Typology Code available for payment source | Attending: Emergency Medicine | Admitting: Emergency Medicine

## 2021-11-30 ENCOUNTER — Other Ambulatory Visit: Payer: Self-pay

## 2021-11-30 DIAGNOSIS — E876 Hypokalemia: Secondary | ICD-10-CM | POA: Diagnosis present

## 2021-11-30 DIAGNOSIS — Z7982 Long term (current) use of aspirin: Secondary | ICD-10-CM | POA: Diagnosis not present

## 2021-11-30 DIAGNOSIS — R030 Elevated blood-pressure reading, without diagnosis of hypertension: Secondary | ICD-10-CM | POA: Insufficient documentation

## 2021-11-30 LAB — COMPREHENSIVE METABOLIC PANEL
ALT: 32 U/L (ref 0–53)
ALT: 26 U/L (ref 0–44)
AST: 25 U/L (ref 0–37)
AST: 20 U/L (ref 15–41)
Albumin: 4.1 g/dL (ref 3.5–5.2)
Albumin: 4.4 g/dL (ref 3.5–5.0)
Alkaline Phosphatase: 48 U/L (ref 39–117)
Alkaline Phosphatase: 48 U/L (ref 38–126)
Anion gap: 11 (ref 5–15)
BUN: 17 mg/dL (ref 6–23)
BUN: 14 mg/dL (ref 6–20)
CO2: 31 mEq/L (ref 19–32)
CO2: 33 mmol/L — ABNORMAL HIGH (ref 22–32)
Calcium: 9.9 mg/dL (ref 8.4–10.5)
Calcium: 10.2 mg/dL (ref 8.9–10.3)
Chloride: 99 mEq/L (ref 96–112)
Chloride: 97 mmol/L — ABNORMAL LOW (ref 98–111)
Creatinine, Ser: 1.02 mg/dL (ref 0.40–1.50)
Creatinine, Ser: 1.15 mg/dL (ref 0.61–1.24)
GFR, Estimated: >60 mL/min (ref >60)
GFR: 80.71 mL/min (ref >60.00)
Glucose, Bld: 94 mg/dL (ref 70–99)
Glucose, Bld: 106 mg/dL — ABNORMAL HIGH (ref 70–99)
Potassium: 2.7 mEq/L — CL (ref 3.5–5.1)
Potassium: 2.8 mmol/L — ABNORMAL LOW (ref 3.5–5.1)
Sodium: 141 mEq/L (ref 135–145)
Sodium: 141 mmol/L (ref 135–145)
Total Bilirubin: 0.9 mg/dL (ref 0.2–1.2)
Total Bilirubin: 0.9 mg/dL (ref 0.3–1.2)
Total Protein: 7.5 g/dL (ref 6.0–8.3)
Total Protein: 8.1 g/dL (ref 6.5–8.1)

## 2021-11-30 LAB — MAGNESIUM: Magnesium: 2.4 mg/dL (ref 1.7–2.4)

## 2021-11-30 LAB — CBC WITH DIFFERENTIAL/PLATELET
Abs Immature Granulocytes: 0.03 10*3/uL (ref 0.00–0.07)
Basophils Absolute: 0.1 10*3/uL (ref 0.0–0.1)
Basophils Relative: 1 %
Eosinophils Absolute: 0.2 10*3/uL (ref 0.0–0.5)
Eosinophils Relative: 2 %
HCT: 47.9 % (ref 39.0–52.0)
Hemoglobin: 15.7 g/dL (ref 13.0–17.0)
Immature Granulocytes: 0 %
Lymphocytes Relative: 34 %
Lymphs Abs: 3.1 10*3/uL (ref 0.7–4.0)
MCH: 27.1 pg (ref 26.0–34.0)
MCHC: 32.8 g/dL (ref 30.0–36.0)
MCV: 82.6 fL (ref 80.0–100.0)
Monocytes Absolute: 0.7 10*3/uL (ref 0.1–1.0)
Monocytes Relative: 8 %
Neutro Abs: 5.1 10*3/uL (ref 1.7–7.7)
Neutrophils Relative %: 55 %
Platelets: 238 10*3/uL (ref 150–400)
RBC: 5.8 MIL/uL (ref 4.22–5.81)
RDW: 13.5 % (ref 11.5–15.5)
WBC: 9.1 10*3/uL (ref 4.0–10.5)
nRBC: 0 % (ref 0.0–0.2)

## 2021-11-30 LAB — PRO B NATRIURETIC PEPTIDE: NT-Pro BNP: 139 pg/mL (ref 0–210)

## 2021-11-30 MED ORDER — POTASSIUM CHLORIDE 10 MEQ/100ML IV SOLN
10.0000 meq | INTRAVENOUS | Status: AC
  Administered 2021-11-30 (×2): 10 meq via INTRAVENOUS
  Filled 2021-11-30 (×2): qty 100

## 2021-11-30 MED ORDER — POTASSIUM CHLORIDE CRYS ER 20 MEQ PO TBCR
40.0000 meq | EXTENDED_RELEASE_TABLET | Freq: Once | ORAL | Status: AC
  Administered 2021-11-30: 40 meq via ORAL
  Filled 2021-11-30: qty 2

## 2021-11-30 MED ORDER — POTASSIUM CHLORIDE CRYS ER 20 MEQ PO TBCR: 20.0000 meq | EXTENDED_RELEASE_TABLET | Freq: Every day | ORAL | 0 refills | Status: DC

## 2021-11-30 NOTE — ED Provider Notes (Signed)
[facility] EMERGENCY DEPT Provider Note   CSN: [id number] Arrival date & time: 11/30/21  1646     History  Chief Complaint  Patient presents with   Follow-up    Low potassium    Seth Carson is a 61 y.o. male.  Patient is a 61 year old male who presents with abnormal blood work.  He had appointment with his primary care doctor and had some routine labs checked.  His potassium was low at 2.7 and they advised him to come to the emergency room.  He is asymptomatic.  He states that he has had low potassiums in the past.  He does take hydrochlorothiazide.  He says that he was previously taking potassium supplements but his doctor did not refill his last prescription and so he thought he was not supposed to take it anymore.  Also he ran out of his blood pressure medicines about a month ago and just restarted taking them yesterday other than his hydrochlorothiazide which she has been taking.  He otherwise feels well.  He does not have any symptoms.  He had a little bit of a headache earlier but none now.  No myalgias or weakness.       Home Medications Prior to Admission medications   Medication Sig Start Date End Date Taking? Authorizing Provider  potassium chloride SA (KLOR-CON M) 20 MEQ tablet Take 1 tablet (20 mEq total) by mouth daily. 11/30/21  Yes Rolan Bucco, MD  amLODipine (NORVASC) 10 MG tablet Take 1 tablet (10 mg total) by mouth daily. 11/29/21   [name], MD  aspirin EC 81 MG tablet Take 81 mg by mouth daily.    [provider]  hydrochlorothiazide (HYDRODIURIL) 25 MG tablet Take 1 tablet (25 mg total) by mouth daily. 11/29/21   [name], MD  lisinopril (ZESTRIL) 10 MG tablet Take 1 tablet (10 mg total) by mouth daily. 11/29/21   [name], MD  potassium chloride SA (KLOR-CON) 20 MEQ tablet Take 1 tablet (20 mEq total) by mouth daily. T Patient not taking: Reported on 11/29/2021 09/07/20   [name], MD  rosuvastatin (CRESTOR)  20 MG tablet Take 1 tablet (20 mg total) by mouth daily. 11/29/21   [name], MD      Allergies    Patient has no known allergies.    Review of Systems   Review of Systems  Constitutional:  Negative for chills, diaphoresis, fatigue and fever.  HENT:  Negative for congestion, rhinorrhea and sneezing.   Eyes: Negative.   Respiratory:  Negative for cough, chest tightness and shortness of breath.   Cardiovascular:  Negative for chest pain and leg swelling.  Gastrointestinal:  Negative for abdominal pain, blood in stool, diarrhea, nausea and vomiting.  Genitourinary:  Negative for difficulty urinating, flank pain, frequency and hematuria.  Musculoskeletal:  Negative for arthralgias and back pain.  Skin:  Negative for rash.  Neurological:  Positive for headaches. Negative for dizziness, speech difficulty, weakness and numbness.    Physical Exam Updated Vital Signs BP (!) 213/119   Pulse 67   Temp 98.5 F (36.9 C) (Oral)   Resp 18   Ht 5\' 10"  (1.778 m)   Wt 109.3 kg   SpO2 94%   BMI 34.58 kg/m  Physical Exam Constitutional:      Appearance: He is well-developed.  HENT:     Head: Normocephalic and atraumatic.  Eyes:     Pupils: Pupils are equal, round, and reactive to light.  Cardiovascular:     Rate and Rhythm: Normal rate and regular rhythm.     Heart sounds: Normal heart sounds.  Pulmonary:     Effort: Pulmonary effort is normal. No respiratory distress.     Breath sounds: Normal breath sounds. No wheezing or rales.  Chest:     Chest wall: No tenderness.  Abdominal:     General: Bowel sounds are normal.     Palpations: Abdomen is soft.     Tenderness: There is no abdominal tenderness. There is no guarding or rebound.  Musculoskeletal:        General: Normal range of motion.     Cervical back: Normal range of motion and neck supple.  Lymphadenopathy:     Cervical: No cervical adenopathy.  Skin:    General: Skin is warm and dry.     Findings: No rash.   Neurological:     Mental Status: He is alert and oriented to person, place, and time.     ED Results / Procedures / Treatments   Labs (all labs ordered are listed, but only abnormal results are displayed) Labs Reviewed  COMPREHENSIVE METABOLIC PANEL - Abnormal; Notable for the following components:      Result Value   Potassium 2.8 (*)    Chloride 97 (*)    CO2 33 (*)    Glucose, Bld 106 (*)    All other components within normal limits  CBC WITH DIFFERENTIAL/PLATELET  MAGNESIUM    EKG None  Radiology DG Chest 2 View  Result Date: 11/29/2021 CLINICAL DATA:  Chest congestion, dyspnea EXAM: CHEST - 2 VIEW COMPARISON:  12/31/2008 chest radiograph. FINDINGS: Stable cardiomediastinal silhouette with normal heart size. No pneumothorax. No pleural effusion. Lungs appear clear, with no acute consolidative airspace disease and no pulmonary edema. IMPRESSION: No active cardiopulmonary disease. Electronically Signed   By: Delbert Phenix M.D.   On: 11/29/2021 09:45    Procedures Procedures    Medications Ordered in ED Medications  potassium chloride 10 mEq in 100 mL IVPB (10 mEq Intravenous New Bag/Given 11/30/21 2058)  potassium chloride SA (KLOR-CON M) CR tablet 40 mEq (40 mEq Oral Given 11/30/21 2054)    ED Course/ Medical Decision Making/ A&P                           Medical Decision Making Problems Addressed: Elevated blood pressure reading: chronic illness or injury that poses a threat to life or bodily functions Hypokalemia: acute illness or injury that poses a threat to life or bodily functions  Amount and/or Complexity of Data Reviewed External Data Reviewed: notes. Labs: ordered. Decision-making details documented in ED Course.  Risk Prescription drug management. Decision regarding hospitalization.   Patient is a 61 year old male who presents with low potassium.  His physician notes were reviewed.  His labs were rechecked in the ED.  His potassium is 2.8.  I do see  that he has had some intermittently low potassium in the past down to 2.9.  His other labs are nonconcerning.  His magnesium level is normal.  He was started on oral and IV potassium replacement.  We will also give him prescription for 3-day supply of potassium.  At this point I feel that he can be treated as an outpatient.  I do not feel that he has any requirements for hospitalization.  He does feel that he can have close follow-up with his PCP to have his potassium rechecked within the  next week or so.  Return precautions were given.  His blood pressures also been elevated.  He is asymptomatic.  He just restarted his blood pressure medicines yesterday.  We will need to have this rechecked by his PCP as well.  Final Clinical Impression(s) / ED Diagnoses Final diagnoses:  Hypokalemia  Elevated blood pressure reading    Rx / DC Orders ED Discharge Orders          Ordered    potassium chloride SA (KLOR-CON M) 20 MEQ tablet  Daily        11/30/21 2148              Rolan BuccoBelfi, Kaitlynne Wenz, MD 11/30/21 2152

## 2021-11-30 NOTE — ED Triage Notes (Signed)
"  Went to doctor yesterday and had routine labs and my potassium came back at 2.7 so they told me to come to ED" per pt Patient denies any symptoms or complaints

## 2021-11-30 NOTE — ED Notes (Signed)
Pt c/o arm pain near IV site, IV site assessed, skin is warm to touch and no notable swelling per RN and pt denies that arm looks different. Offered to change IV site, pt declined. Potassium cont to infuse IVPB with NS.

## 2021-11-30 NOTE — Discharge Instructions (Addendum)
Follow-up with your primary care doctor within the next week to have your potassium rechecked.  Return to the emergency room if you have any worsening symptoms.

## 2021-11-30 NOTE — ED Notes (Signed)
Discharge instructions discussed with pt. Pt verbalized understanding with no questions at this time. Pt to follow up with PCP to recheck lab values

## 2021-11-30 NOTE — Telephone Encounter (Signed)
Hope from the lab called and stated patients Potassium is low at 2.7

## 2021-11-30 NOTE — Telephone Encounter (Signed)
Spoke to pt and he understands and he will make his way to the ER this evening

## 2021-11-30 NOTE — ED Notes (Signed)
Lab to add on mg to previously collected samples

## 2021-11-30 NOTE — Telephone Encounter (Signed)
See lab notes, should be seen at ER today for repeat testing and repletion, especially with some of his symptoms yesterday.  I called patient, left voicemail to contact us as soon as possible.  Please try to call him again this afternoon if he has not called back.

## 2021-12-07 ENCOUNTER — Ambulatory Visit (INDEPENDENT_AMBULATORY_CARE_PROVIDER_SITE_OTHER): Payer: No Typology Code available for payment source | Admitting: Family Medicine

## 2021-12-07 VITALS — BP 138/80 | HR 74 | Temp 98.0°F | Resp 15 | Ht 70.0 in | Wt 239.0 lb

## 2021-12-07 DIAGNOSIS — R0609 Other forms of dyspnea: Secondary | ICD-10-CM | POA: Diagnosis not present

## 2021-12-07 DIAGNOSIS — E876 Hypokalemia: Secondary | ICD-10-CM

## 2021-12-07 DIAGNOSIS — I10 Essential (primary) hypertension: Secondary | ICD-10-CM

## 2021-12-07 LAB — BASIC METABOLIC PANEL
BUN: 12 mg/dL (ref 6–23)
CO2: 33 mEq/L — ABNORMAL HIGH (ref 19–32)
Calcium: 10 mg/dL (ref 8.4–10.5)
Chloride: 100 mEq/L (ref 96–112)
Creatinine, Ser: 1.08 mg/dL (ref 0.40–1.50)
GFR: 75.35 mL/min (ref >60.00)
Glucose, Bld: 101 mg/dL — ABNORMAL HIGH (ref 70–99)
Potassium: 3.1 mEq/L — ABNORMAL LOW (ref 3.5–5.1)
Sodium: 141 mEq/L (ref 135–145)

## 2021-12-07 MED ORDER — POTASSIUM CHLORIDE CRYS ER 20 MEQ PO TBCR: 20.0000 meq | EXTENDED_RELEASE_TABLET | Freq: Every day | ORAL | 0 refills | Status: DC

## 2021-12-07 MED ORDER — POTASSIUM CHLORIDE CRYS ER 20 MEQ PO TBCR: 20.0000 meq | EXTENDED_RELEASE_TABLET | Freq: Every day | ORAL | 1 refills | Status: DC

## 2021-12-07 NOTE — Patient Instructions (Signed)
Blood pressure looks better today.  No change in medications for now.  Restart potassium supplement once per day.  Depending on labs today may need to repeat again early next week.  I will let you know.  Follow-up with me in 3 months.  I will refer you to cardiology to discuss the shortness of breath with exertion, but if any new or worsening symptoms be seen right away.  Take care and thanks for coming in today.

## 2021-12-07 NOTE — Progress Notes (Signed)
Subjective:  Patient ID: Seth Carson, male    DOB: June 26, 1960  Age: 61 y.o. MRN: [id number]  CC:  Chief Complaint  Patient presents with   Hypertension    Pt here for 1 week recheck, no concerns from pt    HPI Seth Carson presents for   Hypertension: Hypertension follow-up and ER follow-up for hypokalemia. last visit with me June 8.  Uncontrolled blood pressure at that time with medication nonadherence.  Restarted his amlodipine, hydrochlorothiazide, lisinopril, and his rosuvastatin for hyperlipidemia.  Some dyspnea on exertion noted last visit.  BMP was normal, no sign of edema on chest x-ray.  EKG was without acute findings. Restarted meds, feeling better, not tired. DOE with walking fast long distances, but better. No CP, swelling. Last echo in 2013. EF>55% at that time.    Home readings: BP Readings from Last 3 Encounters:  12/07/21 138/80  11/30/21 (!) 178/100  11/29/21 (!) 196/102   Lab Results  Component Value Date   CREATININE 1.15 11/30/2021   Hypokalemia Potassium 2.7 at his last visit.  ER visit noted.  Had reported previously taking potassium supplements but had been off medication.  Reported feeling well at the ER visit without new symptoms.  Potassium 2.8 at that time.  He was treated with oral and IV potassium replacement, then prescription given.  Outpatient follow-up planned. Took few days of potassium - for 3 days - ran out 4 days ago.  Feels good today. No hear palpitations, cp, dizziness.    History Patient Active Problem List   Diagnosis Date Noted   Unspecified sleep apnea 08/17/2013   HTN (hypertension) 06/10/2013   Hyperlipidemia 06/10/2013   Past Medical History:  Diagnosis Date   Hyperlipidemia    Hypertension    Sleep apnea    uses BI-PAP    Past Surgical History:  Procedure Laterality Date   DENTAL EXAMINATION UNDER ANESTHESIA  at age 89   No Known Allergies Prior to Admission medications   Medication Sig Start Date End  Date Taking? Authorizing Provider  amLODipine (NORVASC) 10 MG tablet Take 1 tablet (10 mg total) by mouth daily. 11/29/21  Yes [name], MD  aspirin EC 81 MG tablet Take 81 mg by mouth daily.   Yes [provider]  hydrochlorothiazide (HYDRODIURIL) 25 MG tablet Take 1 tablet (25 mg total) by mouth daily. 11/29/21  Yes [name], MD  lisinopril (ZESTRIL) 10 MG tablet Take 1 tablet (10 mg total) by mouth daily. 11/29/21  Yes [name], MD  potassium chloride SA (KLOR-CON M) 20 MEQ tablet Take 1 tablet (20 mEq total) by mouth daily. 11/30/21  Yes Rolan Bucco, MD  potassium chloride SA (KLOR-CON) 20 MEQ tablet Take 1 tablet (20 mEq total) by mouth daily. T 09/07/20  Yes [name], MD  rosuvastatin (CRESTOR) 20 MG tablet Take 1 tablet (20 mg total) by mouth daily. 11/29/21  Yes [name], MD   Social History   Socioeconomic History   Marital status: Single    Spouse name: n/a   Number of children: 0   Years of education: 12   Highest education level: Not on file  Occupational History   Occupation: Systems developer  Tobacco Use   Smoking status: Never   Smokeless tobacco: Never  Vaping Use   Vaping Use: Never used  Substance and Sexual Activity   Alcohol use: Not Currently   Drug use: No   Sexual activity: Not on file  Other Topics Concern   Not on file  Social History Narrative   Marital status: single;       Children:  None       Lives with his mother "she won't let me leave."      Employment:  [employer] x 21 years; repairman; happy      Tobacco: none      Alcohol:  none      Exercise:  Sporadic.  Some walking.   Social Determinants of Health   Financial Resource Strain: Not on file  Food Insecurity: Not on file  Transportation Needs: Not on file  Physical Activity: Not on file  Stress: Not on file  Social Connections: Not on file  Intimate Partner Violence: Not on file    Review of Systems  Constitutional:  Negative for  fatigue and unexpected weight change.  Eyes:  Negative for visual disturbance.  Respiratory:  Negative for cough, chest tightness and shortness of breath.   Cardiovascular:  Negative for chest pain, palpitations and leg swelling.  Gastrointestinal:  Negative for abdominal pain and blood in stool.  Neurological:  Negative for dizziness, light-headedness and headaches.     Objective:   Vitals:   12/07/21 0904  BP: 138/80  Pulse: 74  Resp: 15  Temp: 98 F (36.7 C)  TempSrc: Temporal  SpO2: 96%  Weight: 239 lb (108.4 kg)  Height: 5\' 10"  (1.778 m)     Physical Exam Vitals reviewed.  Constitutional:      Appearance: He is well-developed.  HENT:     Head: Normocephalic and atraumatic.  Neck:     Vascular: No carotid bruit or JVD.  Cardiovascular:     Rate and Rhythm: Normal rate and regular rhythm.     Heart sounds: Normal heart sounds. No murmur heard. Pulmonary:     Effort: Pulmonary effort is normal.     Breath sounds: Normal breath sounds. No rales.  Musculoskeletal:     Right lower leg: No edema.     Left lower leg: No edema.  Skin:    General: Skin is warm and dry.  Neurological:     Mental Status: He is alert and oriented to person, place, and time.  Psychiatric:        Mood and Affect: Mood normal.        Assessment & Plan:  Seth Carson is a 61 y.o. male . Hypokalemia - Plan: Basic metabolic panel, potassium chloride SA (KLOR-CON M) 20 MEQ tablet, DISCONTINUED: potassium chloride SA (KLOR-CON M) 20 MEQ tablet  Essential hypertension - Plan: Basic metabolic panel, Ambulatory referral to Cardiology  DOE (dyspnea on exertion) - Plan: Ambulatory referral to Cardiology  Symptomatically improved with restart of antihypertensives, blood pressure now controlled on current regimen.  Still some dyspnea exertion but improved.  Refer to cardiology given underlying risk factors for cardiac disease.  No change in meds for now.  Unfortunately off potassium  supplement past few days.  Restart potassium 20 mEq, check BMP today, and depending on that level may need to repeat with a lab only visit early next week.  ER precautions given.  Meds ordered this encounter  Medications   DISCONTD: potassium chloride SA (KLOR-CON M) 20 MEQ tablet    Sig: Take 1 tablet (20 mEq total) by mouth daily.    Dispense:  3 tablet    Refill:  0   potassium chloride SA (KLOR-CON M) 20 MEQ tablet    Sig: Take 1 tablet (20 mEq  total) by mouth daily.    Dispense:  90 tablet    Refill:  1    Disregard prior rx for #3.   Patient Instructions  Blood pressure looks better today.  No change in medications for now.  Restart potassium supplement once per day.  Depending on labs today may need to repeat again early next week.  I will let you know.  Follow-up with me in 3 months.  I will refer you to cardiology to discuss the shortness of breath with exertion, but if any new or worsening symptoms be seen right away.  Take care and thanks for coming in today.    Signed,   Meredith Staggers, MD [facility], [facility] 12/07/21 9:47 AM

## 2021-12-18 ENCOUNTER — Other Ambulatory Visit: Payer: Self-pay | Admitting: Family Medicine

## 2021-12-18 ENCOUNTER — Other Ambulatory Visit (INDEPENDENT_AMBULATORY_CARE_PROVIDER_SITE_OTHER): Payer: No Typology Code available for payment source

## 2021-12-18 DIAGNOSIS — E876 Hypokalemia: Secondary | ICD-10-CM

## 2021-12-18 LAB — BASIC METABOLIC PANEL
BUN: 15 mg/dL (ref 6–23)
CO2: 30 mEq/L (ref 19–32)
Calcium: 9.9 mg/dL (ref 8.4–10.5)
Chloride: 100 mEq/L (ref 96–112)
Creatinine, Ser: 1.06 mg/dL (ref 0.40–1.50)
GFR: 77.04 mL/min (ref >60.00)
Glucose, Bld: 101 mg/dL — ABNORMAL HIGH (ref 70–99)
Potassium: 3 mEq/L — ABNORMAL LOW (ref 3.5–5.1)
Sodium: 140 mEq/L (ref 135–145)

## 2021-12-22 ENCOUNTER — Other Ambulatory Visit: Payer: Self-pay | Admitting: Family Medicine

## 2021-12-22 DIAGNOSIS — E876 Hypokalemia: Secondary | ICD-10-CM

## 2021-12-22 NOTE — Progress Notes (Signed)
Persistent hypokalemia, advised patient to increase potassium to 40 mill equivalents daily, lab visit next week.

## 2022-01-02 ENCOUNTER — Other Ambulatory Visit (INDEPENDENT_AMBULATORY_CARE_PROVIDER_SITE_OTHER): Payer: No Typology Code available for payment source

## 2022-01-02 DIAGNOSIS — E876 Hypokalemia: Secondary | ICD-10-CM

## 2022-01-02 LAB — BASIC METABOLIC PANEL
BUN: 11 mg/dL (ref 6–23)
CO2: 33 mEq/L — ABNORMAL HIGH (ref 19–32)
Calcium: 9.5 mg/dL (ref 8.4–10.5)
Chloride: 100 mEq/L (ref 96–112)
Creatinine, Ser: 1.25 mg/dL (ref 0.40–1.50)
GFR: 63.2 mL/min (ref >60.00)
Glucose, Bld: 85 mg/dL (ref 70–99)
Potassium: 3.4 mEq/L — ABNORMAL LOW (ref 3.5–5.1)
Sodium: 140 mEq/L (ref 135–145)

## 2022-01-24 NOTE — Progress Notes (Signed)
Referring-[name], MD Reason for referral-dyspnea and hypertension  HPI: 61 year old male for evaluation of dyspnea and hypertension at request of Meredith Staggers, MD.  Echocardiogram February 2013 showed normal LV function, grade 1 diastolic dysfunction and moderate aortic insufficiency.  Chest x-ray June 2023 showed no active cardiopulmonary disease.  Laboratories June 2023 showed proBNP 139, hemoglobin 15.7, and potassium 2.7.  Total cholesterol 347 with LDL 122.  Patient notes dyspnea with more vigorous activities but not routine activities.  No orthopnea, PND, chest pain, palpitations or syncope.  Occasional chronic minimal pedal edema.  He recently ran out of his blood pressure medications and was noted to have significant hypertension.  His medications were resumed and his blood pressure has now improved.  Cardiology asked to evaluate.  Current Outpatient Medications  Medication Sig Dispense Refill   amLODipine (NORVASC) 10 MG tablet Take 1 tablet (10 mg total) by mouth daily. 90 tablet 2   aspirin EC 81 MG tablet Take 81 mg by mouth daily.     hydrochlorothiazide (HYDRODIURIL) 25 MG tablet Take 1 tablet (25 mg total) by mouth daily. 90 tablet 2   lisinopril (ZESTRIL) 10 MG tablet Take 1 tablet (10 mg total) by mouth daily. 90 tablet 1   potassium chloride SA (KLOR-CON M) 20 MEQ tablet Take 1 tablet (20 mEq total) by mouth daily. 90 tablet 1   rosuvastatin (CRESTOR) 20 MG tablet Take 1 tablet (20 mg total) by mouth daily. 90 tablet 2   No current facility-administered medications for this visit.    No Known Allergies   Past Medical History:  Diagnosis Date   Hyperlipidemia    Hypertension    Sleep apnea    uses BI-PAP     Past Surgical History:  Procedure Laterality Date   DENTAL EXAMINATION UNDER ANESTHESIA  at age 31    Social History   Socioeconomic History   Marital status: Single    Spouse name: n/a   Number of children: 0   Years of education: 12    Highest education level: Not on file  Occupational History   Occupation: Systems developer  Tobacco Use   Smoking status: Never   Smokeless tobacco: Never  Vaping Use   Vaping Use: Never used  Substance and Sexual Activity   Alcohol use: Never   Drug use: No   Sexual activity: Not on file  Other Topics Concern   Not on file  Social History Narrative   Marital status: single;       Children:  None       Lives with his mother "she won't let me leave."      Employment:  [employer] x 21 years; repairman; happy      Tobacco: none      Alcohol:  none      Exercise:  Sporadic.  Some walking.   Social Determinants of Health   Financial Resource Strain: Not on file  Food Insecurity: Not on file  Transportation Needs: Not on file  Physical Activity: Not on file  Stress: Not on file  Social Connections: Not on file  Intimate Partner Violence: Not on file    Family History  Problem Relation Age of Onset   Hypertension Mother    Emphysema Father    Cancer Father 56       lung cancer with liver mets; prostate cancer   Hypertension Father    Colon polyps Father    Liver cancer Father  died in 32's per pt   Prostate cancer Father        died in 105's per pt   Hypertension Sister    Colon polyps Sister    Heart disease Brother 48       CABG   Hypertension Brother    Coronary artery disease Brother    Sleep apnea Brother    Cancer Maternal Grandmother    Colon cancer Maternal Grandmother 6   Hypertension Maternal Grandfather    Stroke Maternal Grandfather    Cancer Paternal Grandfather    Esophageal cancer Neg Hx    Pancreatic cancer Neg Hx    Rectal cancer Neg Hx    Stomach cancer Neg Hx     ROS: no fevers or chills, productive cough, hemoptysis, dysphasia, odynophagia, melena, hematochezia, dysuria, hematuria, rash, seizure activity, orthopnea, PND, claudication. Remaining systems are negative.  Physical Exam:   Blood pressure 120/76, pulse 75, height 5'  9" (1.753 m), weight 244 lb 1.9 oz (110.7 kg), SpO2 94 %.  General:  Well developed/well nourished in NAD Skin warm/dry Patient not depressed No peripheral clubbing Back-normal HEENT-normal/normal eyelids Neck supple/normal carotid upstroke bilaterally; no bruits; no JVD; no thyromegaly chest - CTA/ normal expansion CV - RRR/normal S1 and S2; no murmurs, rubs or gallops;  PMI nondisplaced Abdomen -NT/ND, no HSM, no mass, + bowel sounds, no bruit 2+ femoral pulses, no bruits Ext-no edema, chords, 2+ DP Neuro-grossly nonfocal  ECG -November 29, 2021-normal sinus rhythm, left ventricular hypertrophy with repolarization abnormality, left atrial enlargement.  Personally reviewed  A/P  1 hypertension-blood pressure controlled.  However he has significant hypokalemia likely secondary to HCTZ.  Decrease HCTZ to 12.5 mg daily.  Decrease KCl to 20 mill equivalents p.o. daily.  Check potassium and renal function in 1 week.  Follow blood pressure and adjust medications as needed.  2 history of hypokalemia-medication adjustment as outlined above.  Follow potassium.  3 dyspnea on exertion-patient has a history of aortic insufficiency.  We will plan repeat echocardiogram.  Also with strong family history of coronary disease.  I will arrange a calcium score for risk stratification.  If significantly elevated will advance statin.  4 history of moderate aortic insufficiency-plan repeat echocardiogram.  5 obstructive sleep apnea-continue BiPAP.  6 hyperlipidemia-continue Crestor.  Olga Millers, MD

## 2022-02-05 ENCOUNTER — Other Ambulatory Visit: Payer: Self-pay

## 2022-02-05 ENCOUNTER — Other Ambulatory Visit (INDEPENDENT_AMBULATORY_CARE_PROVIDER_SITE_OTHER): Payer: No Typology Code available for payment source

## 2022-02-05 DIAGNOSIS — E876 Hypokalemia: Secondary | ICD-10-CM

## 2022-02-05 LAB — BASIC METABOLIC PANEL
BUN: 14 mg/dL (ref 6–23)
CO2: 27 mEq/L (ref 19–32)
Calcium: 10 mg/dL (ref 8.4–10.5)
Chloride: 102 mEq/L (ref 96–112)
Creatinine, Ser: 1.23 mg/dL (ref 0.40–1.50)
GFR: 64.39 mL/min (ref >60.00)
Glucose, Bld: 105 mg/dL — ABNORMAL HIGH (ref 70–99)
Potassium: 3.2 mEq/L — ABNORMAL LOW (ref 3.5–5.1)
Sodium: 141 mEq/L (ref 135–145)

## 2022-02-06 ENCOUNTER — Other Ambulatory Visit: Payer: Self-pay | Admitting: *Deleted

## 2022-02-06 ENCOUNTER — Encounter: Payer: Self-pay | Admitting: Cardiology

## 2022-02-06 ENCOUNTER — Ambulatory Visit (INDEPENDENT_AMBULATORY_CARE_PROVIDER_SITE_OTHER): Payer: No Typology Code available for payment source | Admitting: Cardiology

## 2022-02-06 VITALS — BP 120/76 | HR 75 | Ht 69.0 in | Wt 244.1 lb

## 2022-02-06 DIAGNOSIS — R0609 Other forms of dyspnea: Secondary | ICD-10-CM | POA: Diagnosis not present

## 2022-02-06 DIAGNOSIS — I351 Nonrheumatic aortic (valve) insufficiency: Secondary | ICD-10-CM

## 2022-02-06 DIAGNOSIS — E7800 Pure hypercholesterolemia, unspecified: Secondary | ICD-10-CM | POA: Diagnosis not present

## 2022-02-06 DIAGNOSIS — I10 Essential (primary) hypertension: Secondary | ICD-10-CM | POA: Diagnosis not present

## 2022-02-06 DIAGNOSIS — E876 Hypokalemia: Secondary | ICD-10-CM

## 2022-02-06 MED ORDER — HYDROCHLOROTHIAZIDE 12.5 MG PO TABS: 12.5000 mg | ORAL_TABLET | Freq: Every day | ORAL | 2 refills | Status: DC

## 2022-02-06 MED ORDER — POTASSIUM CHLORIDE CRYS ER 20 MEQ PO TBCR: 20.0000 meq | EXTENDED_RELEASE_TABLET | Freq: Every day | ORAL | 3 refills | Status: DC

## 2022-02-06 NOTE — Patient Instructions (Signed)
Medication Instructions:   DECREASE HCTZ one (1) tablet by mouth (12.5 mg) daily.  DECREASE K-DUR one (1) tablet by mouth (20 mEq) daily.  *If you need a refill on your cardiac medications before your next appointment, please call your pharmacy*   Lab Work:  Your physician recommends that you return for lab work in one week Wednesday, August 23. You can come in on the day of your appointment anytime between 8:00-4:00.     If you have labs (blood work) drawn today and your tests are completely normal, you will receive your results only by: MyChart Message (if you have MyChart) OR A paper copy in the mail If you have any lab test that is abnormal or we need to change your treatment, we will call you to review the results.   Testing/Procedures:  Your physician has requested that you have an echocardiogram. Echocardiography is a painless test that uses sound waves to create images of your heart. It provides your doctor with information about the size and shape of your heart and how well your heart's chambers and valves are working. This procedure takes approximately one hour. There are no restrictions for this procedure.  Self pay calcium scoring tomorrow at 11:00 at the imaging center downstairs.     Follow-Up: At [facility], you and your health needs are our priority.  As part of our continuing mission to provide you with exceptional heart care, we have created designated Provider Care Teams.  These Care Teams include your primary Cardiologist (physician) and Advanced Practice Providers (APPs -  Physician Assistants and Nurse Practitioners) who all work together to provide you with the care you need, when you need it.  We recommend signing up for the patient portal called "MyChart".  Sign up information is provided on this After Visit Summary.  MyChart is used to connect with patients for Virtual Visits (Telemedicine).  Patients are able to view lab/test results, encounter notes,  upcoming appointments, etc.  Non-urgent messages can be sent to your provider as well.   To learn more about what you can do with MyChart, go to [email or web address].    Your next appointment:   6 month(s)  The format for your next appointment:   In Person  Provider:   Olga Millers, MD    Other Instructions  Your physician wants you to follow-up in: 6 months with Dr. Jens Som.  You will receive a reminder letter in the mail two months in advance. If you don't receive a letter, please call our office to schedule the follow-up appointment.   Important Information About Sugar

## 2022-02-07 ENCOUNTER — Other Ambulatory Visit: Payer: No Typology Code available for payment source

## 2022-02-09 NOTE — Progress Notes (Signed)
Great! Thanks for the update.

## 2022-02-15 ENCOUNTER — Other Ambulatory Visit: Payer: Self-pay | Admitting: *Deleted

## 2022-02-15 DIAGNOSIS — E876 Hypokalemia: Secondary | ICD-10-CM

## 2022-02-15 LAB — BASIC METABOLIC PANEL
BUN: 14 mg/dL (ref 7–25)
CO2: 28 mmol/L (ref 20–32)
Calcium: 9.9 mg/dL (ref 8.6–10.3)
Chloride: 105 mmol/L (ref 98–110)
Creat: 1.12 mg/dL (ref 0.70–1.30)
Glucose, Bld: 96 mg/dL (ref 65–139)
Potassium: 3.4 mmol/L — ABNORMAL LOW (ref 3.5–5.3)
Sodium: 144 mmol/L (ref 135–146)

## 2022-02-15 MED ORDER — POTASSIUM CHLORIDE CRYS ER 20 MEQ PO TBCR: 40.0000 meq | EXTENDED_RELEASE_TABLET | Freq: Every day | ORAL | 3 refills | Status: DC

## 2022-02-19 ENCOUNTER — Ambulatory Visit (HOSPITAL_BASED_OUTPATIENT_CLINIC_OR_DEPARTMENT_OTHER)
Admission: RE | Admit: 2022-02-19 | Discharge: 2022-02-19 | Disposition: A | Discharge status: Home / Self Care | Payer: No Typology Code available for payment source | Source: Ambulatory Visit | Attending: Cardiology | Admitting: Cardiology

## 2022-02-19 DIAGNOSIS — I351 Nonrheumatic aortic (valve) insufficiency: Secondary | ICD-10-CM | POA: Insufficient documentation

## 2022-02-19 DIAGNOSIS — E876 Hypokalemia: Secondary | ICD-10-CM | POA: Insufficient documentation

## 2022-02-19 DIAGNOSIS — I10 Essential (primary) hypertension: Secondary | ICD-10-CM | POA: Insufficient documentation

## 2022-02-19 DIAGNOSIS — E7800 Pure hypercholesterolemia, unspecified: Secondary | ICD-10-CM | POA: Diagnosis present

## 2022-02-19 DIAGNOSIS — R0609 Other forms of dyspnea: Secondary | ICD-10-CM | POA: Insufficient documentation

## 2022-02-19 LAB — ECHOCARDIOGRAM COMPLETE
AR max vel: 2.65 cm2
AV Area VTI: 2.46 cm2
AV Area mean vel: 2.71 cm2
AV Mean grad: 6 mmHg
AV Peak grad: 11.2 mmHg
AV Vena cont: 0.3 cm
Ao pk vel: 1.67 m/s
Area-P 1/2: 3.63 cm2
P 1/2 time: 550 msec
S' Lateral: 3.6 cm

## 2022-02-19 NOTE — Progress Notes (Signed)
  Echocardiogram 2D Echocardiogram has been performed.  Seth Carson F 02/19/2022, 12:30 PM

## 2022-03-04 ENCOUNTER — Encounter: Payer: Self-pay | Admitting: *Deleted

## 2022-03-04 ENCOUNTER — Other Ambulatory Visit: Payer: Self-pay | Admitting: *Deleted

## 2022-03-04 DIAGNOSIS — E876 Hypokalemia: Secondary | ICD-10-CM

## 2022-03-11 ENCOUNTER — Encounter: Payer: Self-pay | Admitting: Family Medicine

## 2022-03-11 ENCOUNTER — Ambulatory Visit (INDEPENDENT_AMBULATORY_CARE_PROVIDER_SITE_OTHER): Payer: No Typology Code available for payment source | Admitting: Family Medicine

## 2022-03-11 ENCOUNTER — Other Ambulatory Visit: Payer: Self-pay

## 2022-03-11 VITALS — BP 132/84 | HR 73 | Temp 98.0°F | Resp 18 | Ht 69.0 in | Wt 243.6 lb

## 2022-03-11 DIAGNOSIS — E876 Hypokalemia: Secondary | ICD-10-CM

## 2022-03-11 DIAGNOSIS — I10 Essential (primary) hypertension: Secondary | ICD-10-CM | POA: Diagnosis not present

## 2022-03-11 DIAGNOSIS — Z23 Encounter for immunization: Secondary | ICD-10-CM

## 2022-03-11 DIAGNOSIS — E782 Mixed hyperlipidemia: Secondary | ICD-10-CM

## 2022-03-11 LAB — COMPREHENSIVE METABOLIC PANEL
ALT: 28 U/L (ref 0–53)
AST: 21 U/L (ref 0–37)
Albumin: 4.3 g/dL (ref 3.5–5.2)
Alkaline Phosphatase: 58 U/L (ref 39–117)
BUN: 16 mg/dL (ref 6–23)
CO2: 29 mEq/L (ref 19–32)
Calcium: 10.1 mg/dL (ref 8.4–10.5)
Chloride: 101 mEq/L (ref 96–112)
Creatinine, Ser: 1.09 mg/dL (ref 0.40–1.50)
GFR: 74.39 mL/min (ref >60.00)
Glucose, Bld: 100 mg/dL — ABNORMAL HIGH (ref 70–99)
Potassium: 3.5 mEq/L (ref 3.5–5.1)
Sodium: 141 mEq/L (ref 135–145)
Total Bilirubin: 0.8 mg/dL (ref 0.2–1.2)
Total Protein: 8 g/dL (ref 6.0–8.3)

## 2022-03-11 LAB — LIPID PANEL
Cholesterol: 148 mg/dL (ref 0–200)
HDL: 41.4 mg/dL (ref >39.00)
LDL Cholesterol: 74 mg/dL (ref 0–99)
NonHDL: 106.58
Total CHOL/HDL Ratio: 4
Triglycerides: 161 mg/dL — ABNORMAL HIGH (ref 0.0–149.0)
VLDL: 32.2 mg/dL (ref 0.0–40.0)

## 2022-03-11 NOTE — Patient Instructions (Signed)
No med changes today unless labs indicate need.  3 month follow up, 2nd shingles vaccine at follow up appointment.  Take care.

## 2022-03-11 NOTE — Progress Notes (Signed)
Subjective:  Patient ID: Seth Carson, male    DOB: [birth year]  Age: 61 y.o. MRN: [id number]  CC:  Chief Complaint  Patient presents with   Follow-up    Patient states he is here for a 3 month follow up    HPI Seth Carson presents for   Hypertension: Improving with restart of hypertensives when discussed June 16.  Was referred to cardiology due to some persistent dyspnea on exertion although that had improved. Cardiology appointment August 16 noted with Dr. Jens Carson.  Blood pressure was controlled but significant hypokalemia likely secondary HCTZ.  HCTZ was decreased to 12.5 mg daily.  Potassium adjustment as below.  Repeat echo with history of aortic insufficiency and calcium scoring with strong family history of coronary disease.  Coronary calcium scoring has not yet been performed  will reschedule, but echo on 02/19/2022 noted.  EF 60 to 65%.  No regional wall motion abnormalities, grade 1 diastolic dysfunction.  Mild aortic valve regurgitation, no aortic stenosis. No further shortness of breath, no chest pain. On amlodipine 10kmg , hctz 12.5mg  qd, lisinopril 10mg  qd Home readings: 120-140/70-90 BP Readings from Last 3 Encounters:  02/06/22 120/76  12/07/21 138/80  11/30/21 (!) 178/100   Lab Results  Component Value Date   CREATININE 1.12 02/14/2022    Hypokalemia Treated with 20 mEq potassium chloride previously.  Low at 3.1 on his June 16 visit, persistent low on June 27, had him doubled the potassium chloride to 40 mill equivalents daily.  Improved at 3.4 on July 12.  Continue same dose with potassium rich foods.  Borderline low most recent testing. Was taking July 14 potassium at that time and thinks was on lower dose of hctz.  No heart palpitations or new fatigue.  Lab Results  Component Value Date   K 3.4 (L) 02/14/2022    Hyperlipidemia: Crestor 20mg  qd. Prior labs off meds. Milk this am. No new myalgia or side effects with meds.  Lab Results  Component Value  Date   CHOL 347 (H) 11/29/2021   HDL 41.10 11/29/2021   LDLCALC 82 09/08/2020   LDLDIRECT 122.0 11/29/2021   TRIG 343.0 (H) 11/29/2021   CHOLHDL 8 11/29/2021   Lab Results  Component Value Date   ALT 26 11/30/2021   AST 20 11/30/2021   ALKPHOS 48 11/30/2021   BILITOT 0.9 11/30/2021   HM: defers flu vaccine, shingrix today.  Immunization History  Administered Date(s) Administered   Influenza,inj,Quad PF,6+ Mos 07/08/2013, 07/28/2014, 04/18/2016, 07/01/2017, 07/28/2018, 03/10/2020   PFIZER(Purple Top)SARS-COV-2 Vaccination 09/15/2019, 10/06/2019   Tdap 07/08/2013     History Patient Active Problem List   Diagnosis Date Noted   Unspecified sleep apnea 08/17/2013   HTN (hypertension) 06/10/2013   Hyperlipidemia 06/10/2013   Past Medical History:  Diagnosis Date   Hyperlipidemia    Hypertension    Sleep apnea    uses BI-PAP    Past Surgical History:  Procedure Laterality Date   DENTAL EXAMINATION UNDER ANESTHESIA  at age 13   No Known Allergies Prior to Admission medications   Medication Sig Start Date End Date Taking? Authorizing Provider  amLODipine (NORVASC) 10 MG tablet Take 1 tablet (10 mg total) by mouth daily. 11/29/21  Yes 5, MD  aspirin EC 81 MG tablet Take 81 mg by mouth daily.   Yes [provider]  hydrochlorothiazide (HYDRODIURIL) 12.5 MG tablet Take 1 tablet (12.5 mg total) by mouth daily. 02/06/22 02/07/23 Yes 02/08/22, MD  lisinopril (ZESTRIL) 10  MG tablet Take 1 tablet (10 mg total) by mouth daily. 11/29/21  Yes Seth Agreste, MD  potassium chloride SA (KLOR-CON M) 20 MEQ tablet Take 2 tablets (40 mEq total) by mouth daily. 02/15/22 02/16/23 Yes Seth Perla, MD  rosuvastatin (CRESTOR) 20 MG tablet Take 1 tablet (20 mg total) by mouth daily. 11/29/21  Yes Seth Agreste, MD   Social History   Socioeconomic History   Marital status: Single    Spouse name: n/a   Number of children: 0   Years of education: 12    Highest education level: Not on file  Occupational History   Occupation: Scientist, research (physical sciences)  Tobacco Use   Smoking status: Never   Smokeless tobacco: Never  Vaping Use   Vaping Use: Never used  Substance and Sexual Activity   Alcohol use: Never   Drug use: No   Sexual activity: Not on file  Other Topics Concern   Not on file  Social History Narrative   Marital status: single;       Children:  None       Lives with his mother "she won't let me leave."      Employment:  [employer] x 21 years; repairman; happy      Tobacco: none      Alcohol:  none      Exercise:  Sporadic.  Some walking.   Social Determinants of Health   Financial Resource Strain: Not on file  Food Insecurity: Not on file  Transportation Needs: Not on file  Physical Activity: Not on file  Stress: Not on file  Social Connections: Not on file  Intimate Partner Violence: Not on file    Review of Systems  Constitutional:  Negative for fatigue and unexpected weight change.  Eyes:  Negative for visual disturbance.  Respiratory:  Negative for cough, chest tightness and shortness of breath.   Cardiovascular:  Negative for chest pain, palpitations and leg swelling.  Gastrointestinal:  Negative for abdominal pain and blood in stool.  Neurological:  Negative for dizziness, light-headedness and headaches.     Objective:   Vitals:   03/11/22 0921  Resp: 18  Weight: 243 lb 9.6 oz (110.5 kg)  Height: 5\' 9"  (1.753 m)     Physical Exam Vitals reviewed.  Constitutional:      Appearance: He is well-developed.  HENT:     Head: Normocephalic and atraumatic.  Neck:     Vascular: No carotid bruit or JVD.  Cardiovascular:     Rate and Rhythm: Normal rate and regular rhythm.     Heart sounds: Normal heart sounds. No murmur heard. Pulmonary:     Effort: Pulmonary effort is normal.     Breath sounds: Normal breath sounds. No rales.  Musculoskeletal:     Right lower leg: No edema.     Left lower leg: No  edema.  Skin:    General: Skin is warm and dry.  Neurological:     Mental Status: He is alert and oriented to person, place, and time.  Psychiatric:        Mood and Affect: Mood normal.        Assessment & Plan:  Seth Carson is a 61 y.o. male . Essential hypertension - Plan: Comprehensive metabolic panel, CANCELED: Basic metabolic panel  -Variable home readings, but overall stable in office, tolerating lower dose of HCTZ.  Continue same along with same doses of lisinopril, amlodipine.  Need for shingles vaccine - Plan: Varicella-zoster vaccine  IM  Hypokalemia - Plan: Comprehensive metabolic panel, CANCELED: Basic metabolic panel  -Improved on most recent testing, currently taking 40 mill equivalents daily potassium supplement.  Now on lower dose of HCTZ.  Anticipate further improvement, check labs.  If continued lows, may need to stop HCTZ altogether.  Mixed hyperlipidemia - Plan: Comprehensive metabolic panel, Lipid panel  -Uncontrolled on prior labs, off medication at that time.  Tolerating current dose of Crestor, continue same.  Check labs, and encouraged to follow-up for the coronary calcium scoring test to help decide on further adjustments if needed.  No orders of the defined types were placed in this encounter.  Patient Instructions  No med changes today unless labs indicate need.  3 month follow up, 2nd shingles vaccine at follow up appointment.  Take care.     Signed,   Seth Staggers, MD [facility], [facility] 03/11/22 9:23 AM

## 2022-05-15 ENCOUNTER — Other Ambulatory Visit: Payer: Self-pay | Admitting: Family Medicine

## 2022-05-15 DIAGNOSIS — I10 Essential (primary) hypertension: Secondary | ICD-10-CM

## 2022-06-13 ENCOUNTER — Ambulatory Visit (INDEPENDENT_AMBULATORY_CARE_PROVIDER_SITE_OTHER): Payer: No Typology Code available for payment source | Admitting: Family Medicine

## 2022-06-13 ENCOUNTER — Encounter: Payer: Self-pay | Admitting: Family Medicine

## 2022-06-13 VITALS — BP 138/78 | HR 55 | Temp 98.2°F | Ht 69.0 in | Wt 250.2 lb

## 2022-06-13 DIAGNOSIS — E876 Hypokalemia: Secondary | ICD-10-CM

## 2022-06-13 DIAGNOSIS — Z23 Encounter for immunization: Secondary | ICD-10-CM

## 2022-06-13 DIAGNOSIS — E782 Mixed hyperlipidemia: Secondary | ICD-10-CM

## 2022-06-13 DIAGNOSIS — I10 Essential (primary) hypertension: Secondary | ICD-10-CM | POA: Diagnosis not present

## 2022-06-13 LAB — COMPREHENSIVE METABOLIC PANEL
ALT: 29 U/L (ref 0–53)
AST: 20 U/L (ref 0–37)
Albumin: 4.4 g/dL (ref 3.5–5.2)
Alkaline Phosphatase: 63 U/L (ref 39–117)
BUN: 15 mg/dL (ref 6–23)
CO2: 30 mEq/L (ref 19–32)
Calcium: 9.9 mg/dL (ref 8.4–10.5)
Chloride: 103 mEq/L (ref 96–112)
Creatinine, Ser: 1.02 mg/dL (ref 0.40–1.50)
GFR: 80.41 mL/min (ref >60.00)
Glucose, Bld: 105 mg/dL — ABNORMAL HIGH (ref 70–99)
Potassium: 3.3 mEq/L — ABNORMAL LOW (ref 3.5–5.1)
Sodium: 143 mEq/L (ref 135–145)
Total Bilirubin: 0.6 mg/dL (ref 0.2–1.2)
Total Protein: 7.7 g/dL (ref 6.0–8.3)

## 2022-06-13 MED ORDER — LISINOPRIL 10 MG PO TABS: 10.0000 mg | ORAL_TABLET | Freq: Every day | ORAL | 0 refills | Status: DC

## 2022-06-13 MED ORDER — ROSUVASTATIN CALCIUM 20 MG PO TABS: 20.0000 mg | ORAL_TABLET | Freq: Every day | ORAL | 2 refills | Status: DC

## 2022-06-13 MED ORDER — POTASSIUM CHLORIDE CRYS ER 20 MEQ PO TBCR: 40.0000 meq | EXTENDED_RELEASE_TABLET | Freq: Every day | ORAL | 3 refills | Status: AC

## 2022-06-13 MED ORDER — AMLODIPINE BESYLATE 10 MG PO TABS: 10.0000 mg | ORAL_TABLET | Freq: Every day | ORAL | 2 refills | Status: DC

## 2022-06-13 MED ORDER — HYDROCHLOROTHIAZIDE 12.5 MG PO TABS: 12.5000 mg | ORAL_TABLET | Freq: Every day | ORAL | 2 refills | Status: DC

## 2022-06-13 NOTE — Progress Notes (Signed)
Subjective:  Patient ID: Seth Carson, male    DOB: August 12, 1960  Age: 61 y.o. MRN: [id number]  CC:  Chief Complaint  Patient presents with   Hypertension   Hyperlipidemia    Pt states all is well    HPI Sue Mcalexander presents for   Hypertension: Amlodipine 10 mg daily, lisinopril 10 mg daily, HCTZ 12.5 mg daily.  Potassium 40 mEq daily as of his September visit - same dose.  Tolerating lower dose of HCTZ at that time.  Variable home readings at that time, but continue same regimen.  If continued hypokalemia, option to stop HCTZ. Doing well, no new se's. Working more. Some travel to [place].  Has seen cardiology with reassuring testing in August.  Home readings: none recently.  BP Readings from Last 3 Encounters:  06/13/22 138/78  03/11/22 132/84  02/06/22 120/76   Lab Results  Component Value Date   CREATININE 1.09 03/11/2022   Hypokalemia As above, supplementation and with HCTZ dose adjustments. Lab Results  Component Value Date   K 3.5 03/11/2022   Hyperlipidemia: Crestor 20mg  qd.  Coronary calcium scoring has been ordered previously.  Has not performed  yet. LDL better in September.  No myalgias.  Lab Results  Component Value Date   CHOL 148 03/11/2022   HDL 41.40 03/11/2022   LDLCALC 74 03/11/2022   LDLDIRECT 122.0 11/29/2021   TRIG 161.0 (H) 03/11/2022   CHOLHDL 4 03/11/2022   Lab Results  Component Value Date   ALT 28 03/11/2022   AST 21 03/11/2022   ALKPHOS 58 03/11/2022   BILITOT 0.8 03/11/2022   HM: Flu vaccine today, 2nd shingrix today. Possible se's discussed.  Covid booster recommended at his pharmacy.   History Patient Active Problem List   Diagnosis Date Noted   Unspecified sleep apnea 08/17/2013   HTN (hypertension) 06/10/2013   Hyperlipidemia 06/10/2013   Past Medical History:  Diagnosis Date   Hyperlipidemia    Hypertension    Sleep apnea    uses BI-PAP    Past Surgical History:  Procedure Laterality Date   DENTAL  EXAMINATION UNDER ANESTHESIA  at age 89   No Known Allergies Prior to Admission medications   Medication Sig Start Date End Date Taking? Authorizing Provider  amLODipine (NORVASC) 10 MG tablet Take 1 tablet (10 mg total) by mouth daily. 11/29/21  Yes 01/29/22, MD  aspirin EC 81 MG tablet Take 81 mg by mouth daily.   Yes [provider]  hydrochlorothiazide (HYDRODIURIL) 12.5 MG tablet Take 1 tablet (12.5 mg total) by mouth daily. 02/06/22 02/07/23 Yes 02/09/23, MD  lisinopril (ZESTRIL) 10 MG tablet Take 1 tablet by mouth once daily 05/15/22  Yes 05/17/22, MD  potassium chloride SA (KLOR-CON M) 20 MEQ tablet Take 2 tablets (40 mEq total) by mouth daily. 02/15/22 02/16/23 Yes 02/18/23, MD  rosuvastatin (CRESTOR) 20 MG tablet Take 1 tablet (20 mg total) by mouth daily. 11/29/21  Yes 01/29/22, MD   Social History   Socioeconomic History   Marital status: Single    Spouse name: n/a   Number of children: 0   Years of education: 12   Highest education level: Not on file  Occupational History   Occupation: [name]  Tobacco Use   Smoking status: Never   Smokeless tobacco: Never  Vaping Use   Vaping Use: Never used  Substance and Sexual Activity   Alcohol use: Never   Drug use: No  Sexual activity: Not on file  Other Topics Concern   Not on file  Social History Narrative   Marital status: single;       Children:  None       Lives with his mother "she won't let me leave."      Employment:  [employer] x 21 years; repairman; happy      Tobacco: none      Alcohol:  none      Exercise:  Sporadic.  Some walking.   Social Determinants of Health   Financial Resource Strain: Not on file  Food Insecurity: Not on file  Transportation Needs: Not on file  Physical Activity: Not on file  Stress: Not on file  Social Connections: Not on file  Intimate Partner Violence: Not on file    Review of Systems  Constitutional:  Negative  for fatigue and unexpected weight change.  Eyes:  Negative for visual disturbance.  Respiratory:  Negative for cough, chest tightness and shortness of breath.   Cardiovascular:  Negative for chest pain, palpitations and leg swelling.  Gastrointestinal:  Negative for abdominal pain and blood in stool.  Neurological:  Negative for dizziness, light-headedness and headaches.     Objective:   Vitals:   06/13/22 0822  BP: 138/78  Pulse: (!) 55  Temp: 98.2 F (36.8 C)  SpO2: 95%  Weight: 250 lb 3.2 oz (113.5 kg)  Height: 5\' 9"  (1.753 m)     Physical Exam Vitals reviewed.  Constitutional:      Appearance: He is well-developed.  HENT:     Head: Normocephalic and atraumatic.  Neck:     Vascular: No carotid bruit or JVD.  Cardiovascular:     Rate and Rhythm: Normal rate and regular rhythm.     Heart sounds: Normal heart sounds. No murmur heard. Pulmonary:     Effort: Pulmonary effort is normal.     Breath sounds: Normal breath sounds. No rales.  Musculoskeletal:     Right lower leg: No edema.     Left lower leg: No edema.  Skin:    General: Skin is warm and dry.  Neurological:     Mental Status: He is alert and oriented to person, place, and time.  Psychiatric:        Mood and Affect: Mood normal.        Assessment & Plan:  Marcellas Marchant is a 61 y.o. male . Essential hypertension - Plan: lisinopril (ZESTRIL) 10 MG tablet, amLODipine (NORVASC) 10 MG tablet, hydrochlorothiazide (HYDRODIURIL) 12.5 MG tablet, Comprehensive metabolic panel  -Borderline, but will have him check home readings and continue same regimen for now with RTC precautions.  Check labs, medication adjustments accordingly.  Need for shingles vaccine - Plan: Varicella-zoster vaccine IM  Needs flu shot - Plan: Flu Vaccine QUAD 3mo+IM (Fluarix, Fluzone & Alfiuria Quad PF)  Hypokalemia - Plan: potassium chloride SA (KLOR-CON M) 20 MEQ tablet, Comprehensive metabolic panel  -Borderline but we will  continue same supplementation for now, recheck labs with medication adjustment accordingly  Mixed hyperlipidemia - Plan: rosuvastatin (CRESTOR) 20 MG tablet, Lipid panel Improved LDL on last labs, continue Crestor same dose.  Meds ordered this encounter  Medications   lisinopril (ZESTRIL) 10 MG tablet    Sig: Take 1 tablet (10 mg total) by mouth daily.    Dispense:  90 tablet    Refill:  0   amLODipine (NORVASC) 10 MG tablet    Sig: Take 1 tablet (10 mg total) by  mouth daily.    Dispense:  90 tablet    Refill:  2   hydrochlorothiazide (HYDRODIURIL) 12.5 MG tablet    Sig: Take 1 tablet (12.5 mg total) by mouth daily.    Dispense:  90 tablet    Refill:  2   rosuvastatin (CRESTOR) 20 MG tablet    Sig: Take 1 tablet (20 mg total) by mouth daily.    Dispense:  90 tablet    Refill:  2   potassium chloride SA (KLOR-CON M) 20 MEQ tablet    Sig: Take 2 tablets (40 mEq total) by mouth daily.    Dispense:  180 tablet    Refill:  3   Patient Instructions  Keep a record of your blood pressures outside of the office and if elevated over 130/80 consistently let me  know. Once per week is probably ok as long as BP stable.   No other med changes today.  I will check labs and let you know if there are concerns.  I do recommend the updated COVID booster through pharmacy.  Flu vaccine and final shingles vaccine given today.  Take care!      Signed,   Meredith Staggers, MD [facility], [facility] 06/13/22 8:58 AM

## 2022-06-13 NOTE — Patient Instructions (Addendum)
Keep a record of your blood pressures outside of the office and if elevated over 130/80 consistently let me  know. Once per week is probably ok as long as BP stable.   No other med changes today.  I will check labs and let you know if there are concerns.  I do recommend the updated COVID booster through pharmacy.  Flu vaccine and final shingles vaccine given today.  Take care!

## 2022-11-11 ENCOUNTER — Other Ambulatory Visit: Payer: Self-pay | Admitting: Family Medicine

## 2022-11-11 DIAGNOSIS — I10 Essential (primary) hypertension: Secondary | ICD-10-CM

## 2022-12-13 ENCOUNTER — Ambulatory Visit (INDEPENDENT_AMBULATORY_CARE_PROVIDER_SITE_OTHER): Payer: No Typology Code available for payment source | Admitting: Family Medicine

## 2022-12-13 ENCOUNTER — Encounter: Payer: Self-pay | Admitting: Family Medicine

## 2022-12-13 VITALS — BP 134/76 | HR 62 | Temp 98.2°F | Ht 69.75 in | Wt 247.8 lb

## 2022-12-13 DIAGNOSIS — R7303 Prediabetes: Secondary | ICD-10-CM

## 2022-12-13 DIAGNOSIS — Z125 Encounter for screening for malignant neoplasm of prostate: Secondary | ICD-10-CM | POA: Diagnosis not present

## 2022-12-13 DIAGNOSIS — Z0000 Encounter for general adult medical examination without abnormal findings: Secondary | ICD-10-CM

## 2022-12-13 DIAGNOSIS — E782 Mixed hyperlipidemia: Secondary | ICD-10-CM

## 2022-12-13 DIAGNOSIS — I10 Essential (primary) hypertension: Secondary | ICD-10-CM

## 2022-12-13 DIAGNOSIS — Z131 Encounter for screening for diabetes mellitus: Secondary | ICD-10-CM

## 2022-12-13 DIAGNOSIS — Z8042 Family history of malignant neoplasm of prostate: Secondary | ICD-10-CM | POA: Diagnosis not present

## 2022-12-13 LAB — COMPREHENSIVE METABOLIC PANEL
ALT: 22 U/L (ref 0–53)
AST: 16 U/L (ref 0–37)
Albumin: 4.3 g/dL (ref 3.5–5.2)
Alkaline Phosphatase: 65 U/L (ref 39–117)
BUN: 12 mg/dL (ref 6–23)
CO2: 31 mEq/L (ref 19–32)
Calcium: 9.7 mg/dL (ref 8.4–10.5)
Chloride: 101 mEq/L (ref 96–112)
Creatinine, Ser: 1.08 mg/dL (ref 0.40–1.50)
GFR: 74.82 mL/min (ref >60.00)
Glucose, Bld: 106 mg/dL — ABNORMAL HIGH (ref 70–99)
Potassium: 3.7 mEq/L (ref 3.5–5.1)
Sodium: 142 mEq/L (ref 135–145)
Total Bilirubin: 0.8 mg/dL (ref 0.2–1.2)
Total Protein: 7.6 g/dL (ref 6.0–8.3)

## 2022-12-13 LAB — CBC WITH DIFFERENTIAL/PLATELET
Basophils Absolute: 0 10*3/uL (ref 0.0–0.1)
Basophils Relative: 0.6 % (ref 0.0–3.0)
Eosinophils Absolute: 0.3 10*3/uL (ref 0.0–0.7)
Eosinophils Relative: 4.7 % (ref 0.0–5.0)
HCT: 47.2 % (ref 39.0–52.0)
Hemoglobin: 15.3 g/dL (ref 13.0–17.0)
Lymphocytes Relative: 38.8 % (ref 12.0–46.0)
Lymphs Abs: 2.2 10*3/uL (ref 0.7–4.0)
MCHC: 32.5 g/dL (ref 30.0–36.0)
MCV: 85.8 fl (ref 78.0–100.0)
Monocytes Absolute: 0.5 10*3/uL (ref 0.1–1.0)
Monocytes Relative: 8 % (ref 3.0–12.0)
Neutro Abs: 2.7 10*3/uL (ref 1.4–7.7)
Neutrophils Relative %: 47.9 % (ref 43.0–77.0)
Platelets: 237 10*3/uL (ref 150.0–400.0)
RBC: 5.5 Mil/uL (ref 4.22–5.81)
RDW: 13.8 % (ref 11.5–15.5)
WBC: 5.7 10*3/uL (ref 4.0–10.5)

## 2022-12-13 LAB — LIPID PANEL
Cholesterol: 152 mg/dL (ref 0–200)
HDL: 36 mg/dL — ABNORMAL LOW (ref >39.00)
NonHDL: 116.1
Total CHOL/HDL Ratio: 4
Triglycerides: 202 mg/dL — ABNORMAL HIGH (ref 0.0–149.0)
VLDL: 40.4 mg/dL — ABNORMAL HIGH (ref 0.0–40.0)

## 2022-12-13 LAB — PSA: PSA: 1.64 ng/mL (ref 0.10–4.00)

## 2022-12-13 LAB — LDL CHOLESTEROL, DIRECT: Direct LDL: 43 mg/dL

## 2022-12-13 LAB — HEMOGLOBIN A1C: Hgb A1c MFr Bld: 5.7 % (ref 4.6–6.5)

## 2022-12-13 MED ORDER — ROSUVASTATIN CALCIUM 20 MG PO TABS
20.0000 mg | ORAL_TABLET | Freq: Every day | ORAL | 2 refills | Status: AC
Start: 2022-12-13 — End: ?

## 2022-12-13 MED ORDER — LISINOPRIL 10 MG PO TABS
10.0000 mg | ORAL_TABLET | Freq: Every day | ORAL | 2 refills | Status: AC
Start: 2022-12-13 — End: ?

## 2022-12-13 MED ORDER — HYDROCHLOROTHIAZIDE 12.5 MG PO TABS: 12.5000 mg | ORAL_TABLET | Freq: Every day | ORAL | 2 refills | Status: AC

## 2022-12-13 MED ORDER — AMLODIPINE BESYLATE 10 MG PO TABS: 10.0000 mg | ORAL_TABLET | Freq: Every day | ORAL | 2 refills | Status: AC

## 2022-12-13 NOTE — Progress Notes (Signed)
Subjective:  Patient ID: Seth Carson, male    DOB: [birth year]  Age: 62 y.o. MRN: [id number]  CC:  Chief Complaint  Patient presents with   Annual Exam    Pt is fasting     HPI Seth Carson presents for Annual Exam  Hypertension: Amlodipine 10 mg, hydrochlorothiazide 12.5 mg, lisinopril 10 mg daily.  Potassium 20 mEq, 2/day. No new med side effects.  Home readings: no recent readings.  BP Readings from Last 3 Encounters:  12/13/22 134/76  06/13/22 138/78  03/11/22 132/84   Lab Results  Component Value Date   CREATININE 1.02 06/13/2022    Hyperlipidemia: Crestor 20 mg daily. Some leg pains with walking only. Thighs - long distance. Tried increased fluid intake, improved.  Lab Results  Component Value Date   CHOL 148 03/11/2022   HDL 41.40 03/11/2022   LDLCALC 74 03/11/2022   LDLDIRECT 122.0 11/29/2021   TRIG 161.0 (H) 03/11/2022   CHOLHDL 4 03/11/2022   Lab Results  Component Value Date   ALT 29 06/13/2022   AST 20 06/13/2022   ALKPHOS 63 06/13/2022   BILITOT 0.6 06/13/2022         12/13/2022    8:16 AM 06/13/2022    8:20 AM 03/11/2022    9:23 AM 11/29/2021    8:22 AM 09/07/2020    8:40 AM  Depression screen PHQ 2/9  Decreased Interest 0 0 0 0 0  Down, Depressed, Hopeless 0 0 0 0 0  PHQ - 2 Score 0 0 0 0 0  Altered sleeping 0 0 0    Tired, decreased energy 0 1 0    Change in appetite 0 0 0    Feeling bad or failure about yourself  0 0 0    Trouble concentrating 0 0 0    Moving slowly or fidgety/restless 0 0 0    Suicidal thoughts 0 0 0    PHQ-9 Score 0 1 0      Health Maintenance  Topic Date Due   COVID-19 Vaccine (3 - 2023-24 season) 02/22/2022   INFLUENZA VACCINE  01/23/2023   DTaP/Tdap/Td (2 - Td or Tdap) 07/09/2023   Colonoscopy  09/23/2027   Hepatitis C Screening  Completed   HIV Screening  Completed   Zoster Vaccines- Shingrix  Completed   HPV VACCINES  Aged Out  Colonoscopy 2019, repeat 10 years.  Prostate: does have family history  of prostate cancer - father.  The natural history of prostate cancer and ongoing controversy regarding screening and potential treatment outcomes of prostate cancer has been discussed with the patient. The meaning of a false positive PSA and a false negative PSA has been discussed. He indicates understanding of the limitations of this screening test and wishes  to proceed with screening PSA testing. Lab Results  Component Value Date   PSA1 1.6 01/27/2020   PSA1 1.2 01/25/2019   PSA 1.26 07/28/2014   PSA 1.17 07/08/2013    Immunization History  Administered Date(s) Administered   Influenza Whole 04/07/2018   Influenza,inj,Quad PF,6+ Mos 07/08/2013, 07/28/2014, 04/18/2016, 07/01/2017, 07/28/2018, 03/10/2020, 06/13/2022   PFIZER(Purple Top)SARS-COV-2 Vaccination 09/15/2019, 10/06/2019   Tdap 07/08/2013   Zoster Recombinat (Shingrix) 03/11/2022, 06/13/2022  RSV vaccine - info provided.  Covid booster - considering - recommend with flu vaccine.   No results found. Wears glasses. Optho in 07/2022  Dental: overdue. None in past few years. No current dental issues. Bad experience with dentist in past.   Alcohol: none  Tobacco:  none  Exercise/obesity/prediabetes Walking daily. Yardwork.  Lost 3# since last visit.  Lab Results  Component Value Date   HGBA1C 6.1 (H) 01/27/2020    Body mass index is 35.81 kg/m. Wt Readings from Last 3 Encounters:  12/13/22 247 lb 12.8 oz (112.4 kg)  06/13/22 250 lb 3.2 oz (113.5 kg)  03/11/22 243 lb 9.6 oz (110.5 kg)      History Patient Active Problem List   Diagnosis Date Noted   Unspecified sleep apnea 08/17/2013   HTN (hypertension) 06/10/2013   Hyperlipidemia 06/10/2013   Past Medical History:  Diagnosis Date   Hyperlipidemia    Hypertension    Sleep apnea    uses BI-PAP    Past Surgical History:  Procedure Laterality Date   DENTAL EXAMINATION UNDER ANESTHESIA  at age 50   No Known Allergies Prior to Admission  medications   Medication Sig Start Date End Date Taking? Authorizing Provider  amLODipine (NORVASC) 10 MG tablet Take 1 tablet (10 mg total) by mouth daily. 06/13/22   [name], MD  aspirin EC 81 MG tablet Take 81 mg by mouth daily.    [provider]  hydrochlorothiazide (HYDRODIURIL) 12.5 MG tablet Take 1 tablet (12.5 mg total) by mouth daily. 06/13/22 06/14/23  [name], MD  lisinopril (ZESTRIL) 10 MG tablet Take 1 tablet by mouth once daily 11/11/22   [name], MD  potassium chloride SA (KLOR-CON M) 20 MEQ tablet Take 2 tablets (40 mEq total) by mouth daily. 06/13/22 06/14/23  [name], MD  rosuvastatin (CRESTOR) 20 MG tablet Take 1 tablet (20 mg total) by mouth daily. 06/13/22   [name], MD   Social History   Socioeconomic History   Marital status: Single    Spouse name: n/a   Number of children: 0   Years of education: 12   Highest education level: Not on file  Occupational History   Occupation: Systems developer  Tobacco Use   Smoking status: Never   Smokeless tobacco: Never  Vaping Use   Vaping Use: Never used  Substance and Sexual Activity   Alcohol use: Never   Drug use: No   Sexual activity: Yes  Other Topics Concern   Not on file  Social History Narrative   Marital status: single;       Children:  None       Lives with his mother "she won't let me leave."      Employment:  [employer] x 21 years; repairman; happy      Tobacco: none      Alcohol:  none      Exercise:  Sporadic.  Some walking.   Social Determinants of Health   Financial Resource Strain: Not on file  Food Insecurity: Not on file  Transportation Needs: Not on file  Physical Activity: Not on file  Stress: Not on file  Social Connections: Not on file  Intimate Partner Violence: Not on file    Review of Systems Per HPI. 13 point review of systems per patient health survey noted.  Negative other than as indicated above or in HPI.     Objective:   Vitals:   12/13/22 0816  BP: 134/76  Pulse: 62  Temp: 98.2 F (36.8 C)  TempSrc: Temporal  SpO2: 98%  Weight: 247 lb 12.8 oz (112.4 kg)  Height: 5' 9.75" (1.772 m)     Physical Exam Vitals reviewed.  Constitutional:      Appearance: He  is well-developed.  HENT:     Head: Normocephalic and atraumatic.     Right Ear: External ear normal.     Left Ear: External ear normal.  Eyes:     Conjunctiva/sclera: Conjunctivae normal.     Pupils: Pupils are equal, round, and reactive to light.  Neck:     Thyroid: No thyromegaly.  Cardiovascular:     Rate and Rhythm: Normal rate and regular rhythm.     Heart sounds: Normal heart sounds.  Pulmonary:     Effort: Pulmonary effort is normal. No respiratory distress.     Breath sounds: Normal breath sounds. No wheezing.  Abdominal:     General: There is no distension.     Palpations: Abdomen is soft.     Tenderness: There is no abdominal tenderness.  Musculoskeletal:        General: No tenderness. Normal range of motion.     Cervical back: Normal range of motion and neck supple.  Lymphadenopathy:     Cervical: No cervical adenopathy.  Skin:    General: Skin is warm and dry.  Neurological:     Mental Status: He is alert and oriented to person, place, and time.     Deep Tendon Reflexes: Reflexes are normal and symmetric.  Psychiatric:        Behavior: Behavior normal.        Assessment & Plan:  Trypp Heckmann is a 62 y.o. male . Annual physical exam  - -anticipatory guidance as below in AVS, screening labs above. Health maintenance items as above in HPI discussed/recommended as applicable.   Essential hypertension - Plan: CBC with Differential/Platelet, amLODipine (NORVASC) 10 MG tablet, hydrochlorothiazide (HYDRODIURIL) 12.5 MG tablet, lisinopril (ZESTRIL) 10 MG tablet  -  Stable, tolerating current regimen. Medications refilled. Labs pending as above.   Mixed hyperlipidemia - Plan: Comprehensive  metabolic panel, Lipid panel, rosuvastatin (CRESTOR) 20 MG tablet  -  Stable, tolerating current regimen. Medications refilled. Labs pending as above.   Screening for diabetes mellitus - Plan: Hemoglobin A1c Prediabetes - Plan: Hemoglobin A1c  -Commended on activity, continue walking, check A1c and adjust plan accordingly  Screening for prostate cancer - Plan: PSA Family history of prostate cancer in father - Plan: PSA   Meds ordered this encounter  Medications   amLODipine (NORVASC) 10 MG tablet    Sig: Take 1 tablet (10 mg total) by mouth daily.    Dispense:  90 tablet    Refill:  2   hydrochlorothiazide (HYDRODIURIL) 12.5 MG tablet    Sig: Take 1 tablet (12.5 mg total) by mouth daily.    Dispense:  90 tablet    Refill:  2   lisinopril (ZESTRIL) 10 MG tablet    Sig: Take 1 tablet (10 mg total) by mouth daily.    Dispense:  90 tablet    Refill:  2   rosuvastatin (CRESTOR) 20 MG tablet    Sig: Take 1 tablet (20 mg total) by mouth daily.    Dispense:  90 tablet    Refill:  2   Patient Instructions  Flu and covid vaccines this fall.  No med changes for now. If leg pains not continuing to improve, follow up and we can discuss further.  Here is an option for dentist:  [facility] - Dr. Cyndee Brightly.  Address: [address] [place], [place], [place] [address]  [email or web address]  Phone: [phone]  There is a recommendation for RSV vaccine for patients over age 14.   Typically  would recommend the RSV vaccine as most important for patients over age 72 that have comorbidities that put them at increased risk for severe disease (heart disease such as congestive heart failure, coronary artery disease, lung disease such as asthma or COPD, kidney disease, liver disease, diabetes, chronic or progressive neurologic or muscular conditions, immunosuppressed, or being frail or of advanced age).  For others that do not have these risk factors, there still is some benefit from vaccination since  age is one of the main risk factors for developing severe disease however baseline risk of developing severe disease and requiring hospitalization is likely to be lower compared to those that have comorbidities in addition to age.   CDC does have some information as well: [employer].fi  Preventive Care 36-66 Years Old, Male Preventive care refers to lifestyle choices and visits with your health care provider that can promote health and wellness. Preventive care visits are also called wellness exams. What can I expect for my preventive care visit? Counseling During your preventive care visit, your health care provider may ask about your: Medical history, including: Past medical problems. Family medical history. Current health, including: Emotional well-being. Home life and relationship well-being. Sexual activity. Lifestyle, including: Alcohol, nicotine or tobacco, and drug use. Access to firearms. Diet, exercise, and sleep habits. Safety issues such as seatbelt and bike helmet use. Sunscreen use. Work and work Astronomer. Physical exam Your health care provider will check your: Height and weight. These may be used to calculate your BMI (body mass index). BMI is a measurement that tells if you are at a healthy weight. Waist circumference. This measures the distance around your waistline. This measurement also tells if you are at a healthy weight and may help predict your risk of certain diseases, such as type 2 diabetes and high blood pressure. Heart rate and blood pressure. Body temperature. Skin for abnormal spots. What immunizations do I need?  Vaccines are usually given at various ages, according to a schedule. Your health care provider will recommend vaccines for you based on your age, medical history, and lifestyle or other factors, such as travel or where you work. What tests do I need? Screening Your health care provider may  recommend screening tests for certain conditions. This may include: Lipid and cholesterol levels. Diabetes screening. This is done by checking your blood sugar (glucose) after you have not eaten for a while (fasting). Hepatitis B test. Hepatitis C test. HIV (human immunodeficiency virus) test. STI (sexually transmitted infection) testing, if you are at risk. Lung cancer screening. Prostate cancer screening. Colorectal cancer screening. Talk with your health care provider about your test results, treatment options, and if necessary, the need for more tests. Follow these instructions at home: Eating and drinking  Eat a diet that includes fresh fruits and vegetables, whole grains, lean protein, and low-fat dairy products. Take vitamin and mineral supplements as recommended by your health care provider. Do not drink alcohol if your health care provider tells you not to drink. If you drink alcohol: Limit how much you have to 0-2 drinks a day. Know how much alcohol is in your drink. In the [place] one drink equals one 12 oz bottle of beer (355 mL), one 5 oz glass of wine (148 mL), or one 1 oz glass of hard liquor (44 mL). Lifestyle Brush your teeth every morning and night with fluoride toothpaste. Floss one time each day. Exercise for at least 30 minutes 5 or more days each week. Do not use any  products that contain nicotine or tobacco. These products include cigarettes, chewing tobacco, and vaping devices, such as e-cigarettes. If you need help quitting, ask your health care provider. Do not use drugs. If you are sexually active, practice safe sex. Use a condom or other form of protection to prevent STIs. Take aspirin only as told by your health care provider. Make sure that you understand how much to take and what form to take. Work with your health care provider to find out whether it is safe and beneficial for you to take aspirin daily. Find healthy ways to manage stress, such  as: Meditation, yoga, or listening to music. Journaling. Talking to a trusted person. Spending time with friends and family. Minimize exposure to UV radiation to reduce your risk of skin cancer. Safety Always wear your seat belt while driving or riding in a vehicle. Do not drive: If you have been drinking alcohol. Do not ride with someone who has been drinking. When you are tired or distracted. While texting. If you have been using any mind-altering substances or drugs. Wear a helmet and other protective equipment during sports activities. If you have firearms in your house, make sure you follow all gun safety procedures. What's next? Go to your health care provider once a year for an annual wellness visit. Ask your health care provider how often you should have your eyes and teeth checked. Stay up to date on all vaccines. This information is not intended to replace advice given to you by your health care provider. Make sure you discuss any questions you have with your health care provider. Document Revised: 12/06/2020 Document Reviewed: 12/06/2020 Elsevier Patient Education  2024 [employer].      Signed,   Meredith Staggers, MD [facility], [facility] 12/13/22 9:01 AM

## 2022-12-13 NOTE — Patient Instructions (Addendum)
Flu and covid vaccines this fall.  No med changes for now. If leg pains not continuing to improve, follow up and we can discuss further.  Here is an option for dentist:  [facility] - Dr. Cyndee Brightly.  Address: [address] [facility] [place], [place], [place] [address]  [email or web address]  Phone: [phone]  There is a recommendation for RSV vaccine for patients over age 60.   Typically would recommend the RSV vaccine as most important for patients over age 65 that have comorbidities that put them at increased risk for severe disease (heart disease such as congestive heart failure, coronary artery disease, lung disease such as asthma or COPD, kidney disease, liver disease, diabetes, chronic or progressive neurologic or muscular conditions, immunosuppressed, or being frail or of advanced age).  For others that do not have these risk factors, there still is some benefit from vaccination since age is one of the main risk factors for developing severe disease however baseline risk of developing severe disease and requiring hospitalization is likely to be lower compared to those that have comorbidities in addition to age.   CDC does have some information as well: [employer]  Preventive Care 45-27 Years Old, Male Preventive care refers to lifestyle choices and visits with your health care provider that can promote health and wellness. Preventive care visits are also called wellness exams. What can I expect for my preventive care visit? Counseling During your preventive care visit, your health care provider may ask about your: Medical history, including: Past medical problems. Family medical history. Current health, including: Emotional well-being. Home life and relationship well-being. Sexual activity. Lifestyle, including: Alcohol, nicotine or tobacco, and drug use. Access to firearms. Diet, exercise, and sleep habits. Safety issues such as seatbelt  and bike helmet use. Sunscreen use. Work and work Astronomer. Physical exam Your health care provider will check your: Height and weight. These may be used to calculate your BMI (body mass index). BMI is a measurement that tells if you are at a healthy weight. Waist circumference. This measures the distance around your waistline. This measurement also tells if you are at a healthy weight and may help predict your risk of certain diseases, such as type 2 diabetes and high blood pressure. Heart rate and blood pressure. Body temperature. Skin for abnormal spots. What immunizations do I need?  Vaccines are usually given at various ages, according to a schedule. Your health care provider will recommend vaccines for you based on your age, medical history, and lifestyle or other factors, such as travel or where you work. What tests do I need? Screening Your health care provider may recommend screening tests for certain conditions. This may include: Lipid and cholesterol levels. Diabetes screening. This is done by checking your blood sugar (glucose) after you have not eaten for a while (fasting). Hepatitis B test. Hepatitis C test. HIV (human immunodeficiency virus) test. STI (sexually transmitted infection) testing, if you are at risk. Lung cancer screening. Prostate cancer screening. Colorectal cancer screening. Talk with your health care provider about your test results, treatment options, and if necessary, the need for more tests. Follow these instructions at home: Eating and drinking  Eat a diet that includes fresh fruits and vegetables, whole grains, lean protein, and low-fat dairy products. Take vitamin and mineral supplements as recommended by your health care provider. Do not drink alcohol if your health care provider tells you not to drink. If you drink alcohol: Limit how much you have to 0-2 drinks a day. Know how much  alcohol is in your drink. In the [place] one drink equals one  12 oz bottle of beer (355 mL), one 5 oz glass of wine (148 mL), or one 1 oz glass of hard liquor (44 mL). Lifestyle Brush your teeth every morning and night with fluoride toothpaste. Floss one time each day. Exercise for at least 30 minutes 5 or more days each week. Do not use any products that contain nicotine or tobacco. These products include cigarettes, chewing tobacco, and vaping devices, such as e-cigarettes. If you need help quitting, ask your health care provider. Do not use drugs. If you are sexually active, practice safe sex. Use a condom or other form of protection to prevent STIs. Take aspirin only as told by your health care provider. Make sure that you understand how much to take and what form to take. Work with your health care provider to find out whether it is safe and beneficial for you to take aspirin daily. Find healthy ways to manage stress, such as: Meditation, yoga, or listening to music. Journaling. Talking to a trusted person. Spending time with friends and family. Minimize exposure to UV radiation to reduce your risk of skin cancer. Safety Always wear your seat belt while driving or riding in a vehicle. Do not drive: If you have been drinking alcohol. Do not ride with someone who has been drinking. When you are tired or distracted. While texting. If you have been using any mind-altering substances or drugs. Wear a helmet and other protective equipment during sports activities. If you have firearms in your house, make sure you follow all gun safety procedures. What's next? Go to your health care provider once a year for an annual wellness visit. Ask your health care provider how often you should have your eyes and teeth checked. Stay up to date on all vaccines. This information is not intended to replace advice given to you by your health care provider. Make sure you discuss any questions you have with your health care provider. Document Revised: 12/06/2020  Document Reviewed: 12/06/2020 Elsevier Patient Education  2024 [employer].

## 2023-11-23 IMAGING — DX DG CHEST 2V
2 series · 2 of 2 positions shown · non-contrast
Comparison: 12/31/2008 chest radiograph.

CLINICAL DATA: Chest congestion, dyspnea

EXAM:
CHEST - 2 VIEW

[chest pa]
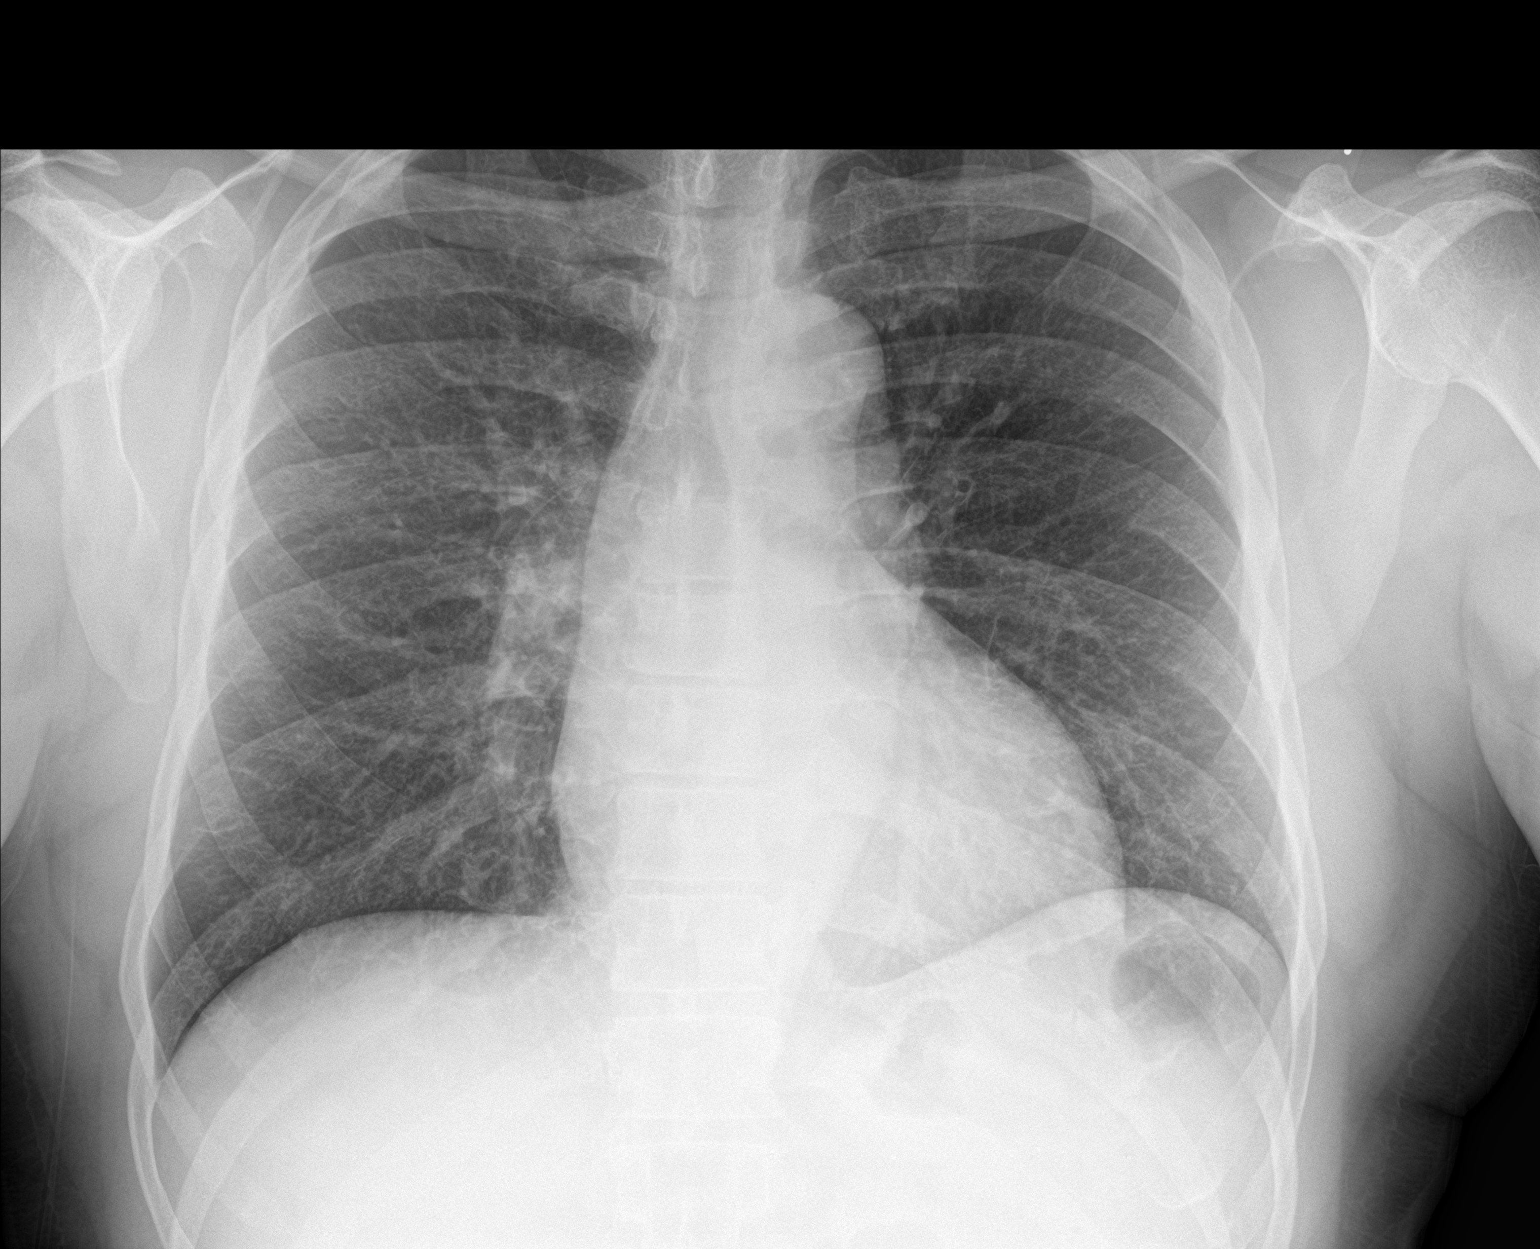

[chest lat]
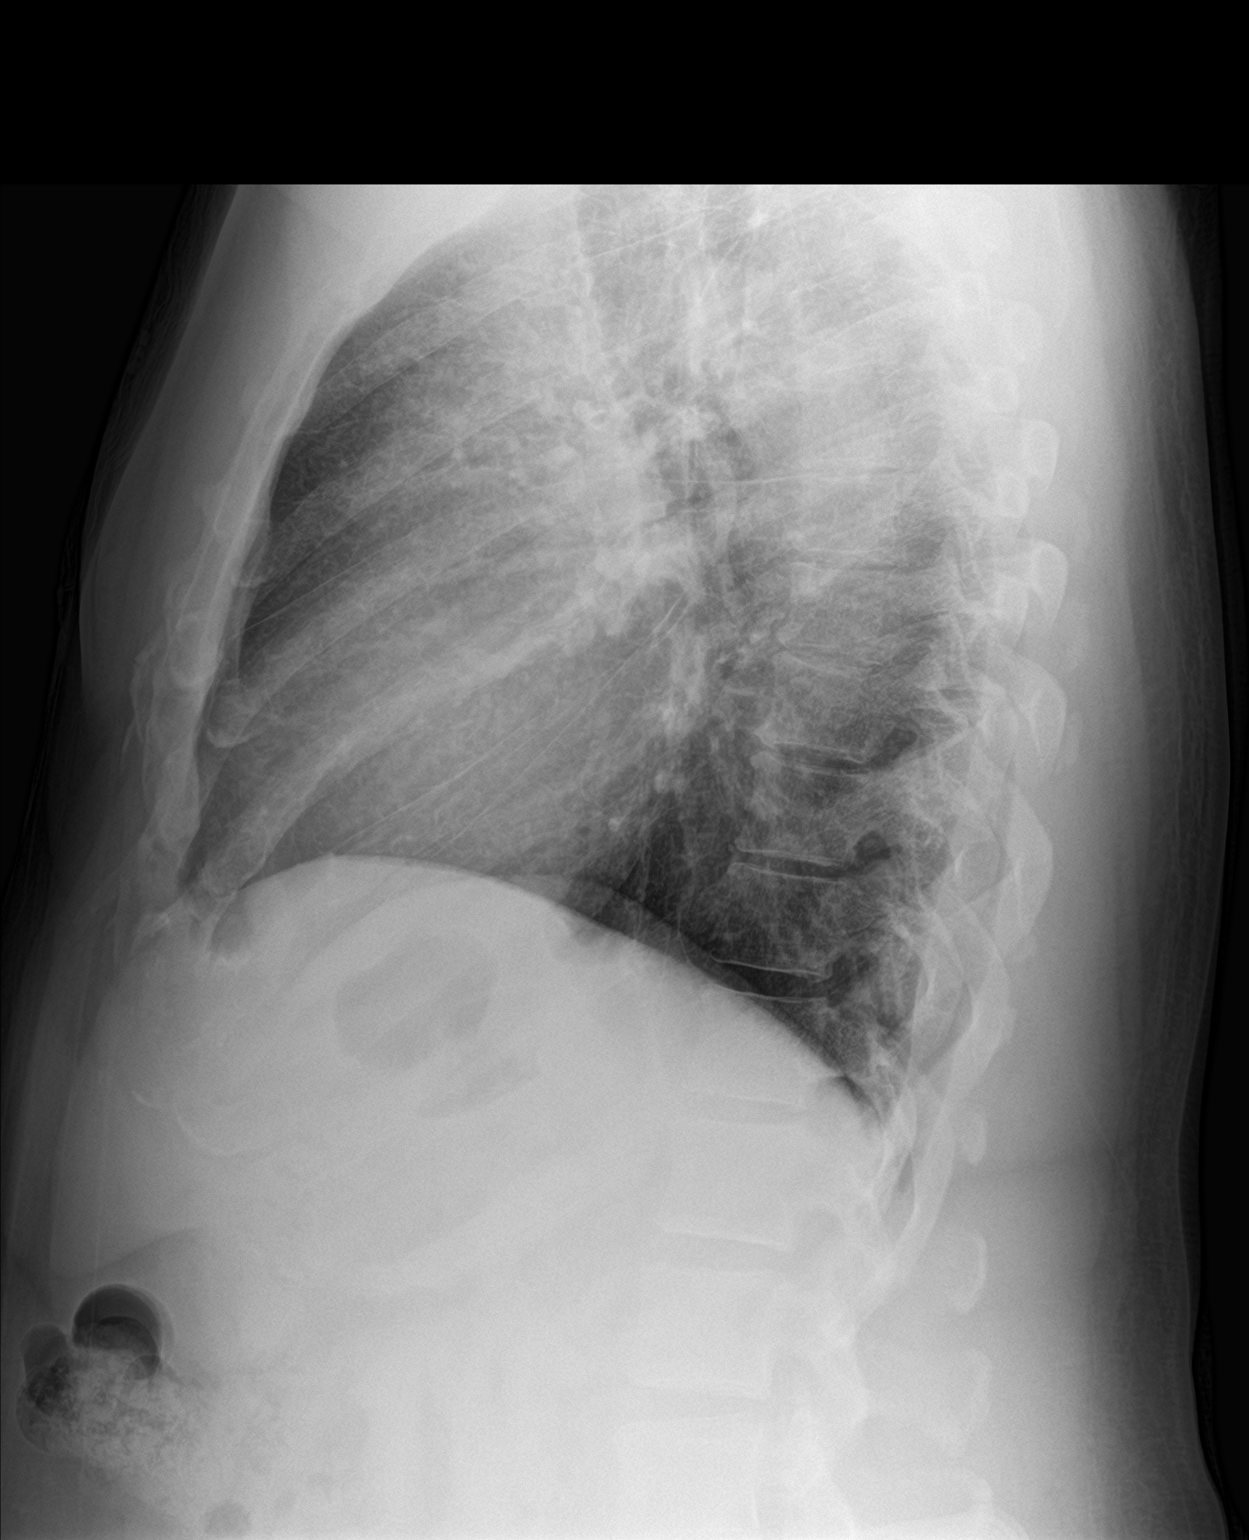

[2 of 2 positions shown; findings below may reference images not displayed]

FINDINGS: Stable cardiomediastinal silhouette with normal heart size. No
pneumothorax. No pleural effusion. Lungs appear clear, with no acute
consolidative airspace disease and no pulmonary edema.
IMPRESSION: No active cardiopulmonary disease.
